# Patient Record
Sex: Female | Born: 1960
Health system: Southern US, Community
[De-identification: ages and names within clinical notes are randomized; demographics above are authoritative.]

## PROBLEM LIST (undated history)

## (undated) DIAGNOSIS — A048 Other specified bacterial intestinal infections: Secondary | ICD-10-CM

## (undated) DIAGNOSIS — D126 Benign neoplasm of colon, unspecified: Secondary | ICD-10-CM

## (undated) DIAGNOSIS — K2210 Ulcer of esophagus without bleeding: Secondary | ICD-10-CM

## (undated) DIAGNOSIS — K219 Gastro-esophageal reflux disease without esophagitis: Secondary | ICD-10-CM

## (undated) DIAGNOSIS — T7840XA Allergy, unspecified, initial encounter: Secondary | ICD-10-CM

## (undated) DIAGNOSIS — I341 Nonrheumatic mitral (valve) prolapse: Secondary | ICD-10-CM

## (undated) DIAGNOSIS — A159 Respiratory tuberculosis unspecified: Secondary | ICD-10-CM

## (undated) HISTORY — DX: Gastro-esophageal reflux disease without esophagitis: K21.9

## (undated) HISTORY — DX: Nonrheumatic mitral (valve) prolapse: I34.1

## (undated) HISTORY — PX: EXCISION OF TONGUE LESION: SHX6434

## (undated) HISTORY — DX: Other specified bacterial intestinal infections: A04.8

## (undated) HISTORY — DX: Ulcer of esophagus without bleeding: K22.10

## (undated) HISTORY — DX: Benign neoplasm of colon, unspecified: D12.6

## (undated) HISTORY — DX: Respiratory tuberculosis unspecified: A15.9

## (undated) HISTORY — PX: WRIST FRACTURE SURGERY: SHX121

## (undated) HISTORY — DX: Allergy, unspecified, initial encounter: T78.40XA

---

## 2001-09-11 ENCOUNTER — Other Ambulatory Visit: Admission: RE | Admit: 2001-09-11 | Discharge: 2001-09-11 | Payer: Self-pay | Admitting: Obstetrics and Gynecology

## 2002-10-13 ENCOUNTER — Other Ambulatory Visit: Admission: RE | Admit: 2002-10-13 | Discharge: 2002-10-13 | Payer: Self-pay | Admitting: Obstetrics and Gynecology

## 2003-09-16 ENCOUNTER — Other Ambulatory Visit: Admission: RE | Admit: 2003-09-16 | Discharge: 2003-09-16 | Payer: Self-pay | Admitting: Obstetrics and Gynecology

## 2004-07-17 ENCOUNTER — Ambulatory Visit (HOSPITAL_COMMUNITY): Admission: RE | Admit: 2004-07-17 | Discharge: 2004-07-17 | Payer: Self-pay | Admitting: Otolaryngology

## 2004-10-04 ENCOUNTER — Other Ambulatory Visit: Admission: RE | Admit: 2004-10-04 | Discharge: 2004-10-04 | Payer: Self-pay | Admitting: Obstetrics and Gynecology

## 2005-06-07 ENCOUNTER — Emergency Department (HOSPITAL_COMMUNITY): Admission: EM | Admit: 2005-06-07 | Discharge: 2005-06-07 | Payer: Self-pay | Admitting: Emergency Medicine

## 2006-01-10 ENCOUNTER — Other Ambulatory Visit: Admission: RE | Admit: 2006-01-10 | Discharge: 2006-01-10 | Payer: Self-pay | Admitting: Gynecology

## 2006-02-27 ENCOUNTER — Ambulatory Visit: Admission: RE | Admit: 2006-02-27 | Discharge: 2006-02-27 | Payer: Self-pay | Admitting: Gynecology

## 2006-04-03 HISTORY — PX: TOTAL ABDOMINAL HYSTERECTOMY: SHX209

## 2006-04-26 ENCOUNTER — Encounter (INDEPENDENT_AMBULATORY_CARE_PROVIDER_SITE_OTHER): Payer: Self-pay | Admitting: Specialist

## 2006-04-26 ENCOUNTER — Inpatient Hospital Stay (HOSPITAL_COMMUNITY): Admission: RE | Admit: 2006-04-26 | Discharge: 2006-04-30 | Payer: Self-pay | Admitting: Obstetrics and Gynecology

## 2010-09-12 ENCOUNTER — Encounter
Admission: RE | Admit: 2010-09-12 | Discharge: 2010-09-12 | Payer: Self-pay | Source: Home / Self Care | Attending: Otolaryngology | Admitting: Otolaryngology

## 2011-01-19 NOTE — Discharge Summary (Signed)
[name], [name]             ACCOUNT NO.:  [id number]   MEDICAL RECORD NO.:  [id number]          PATIENT TYPE:  INP   LOCATION:  1616                         FACILITY:  [facility]   PHYSICIAN:  Miguel Aschoff, M.D.       DATE OF BIRTH:  April 10, 1961   DATE OF ADMISSION:  04/26/2006  DATE OF DISCHARGE:  04/30/2006                                 DISCHARGE SUMMARY   ADMISSION DIAGNOSES:  1. Pelvic pain.  2. Extensive pelvic endometriosis.  3. Leiomyomata.   DISCHARGE DIAGNOSES:  1. Pelvic pain.  2. Extensive pelvic endometriosis.  3. Leiomyomata.   PROCEDURES:  1. Total abdominal hysterectomy.  2. Bilateral salpingo-oophorectomy.  3. Ureterolysis.   HISTORY OF PRESENT ILLNESS:  The patient is a 50 year old, [place] female  who has a long history of pelvic pain and on examination was noted to have  enlargement of uterus with large fibroids what appear to be a poorly mobile  uterus and enlargement of the fibroids to approximately 14-week equivalent  size.  Because of the degree of endometriosis involved, it was elected to  proceed with a total abdominal hysterectomy and bilateral salpingo-  oophorectomy.  The patient had been completely counseled as to the  implications of this procedure and the need for hormone replacement therapy  once it was done.  She agreed that the procedure should be carried out due  to her pelvic pain.   LABORATORY DATA AND X-RAY FINDINGS:  Preoperative labs were obtained with  admission hemoglobin of 9.6, white count of 6.1.  Chemistry profile on  admission was essentially within normal limits.   HOSPITAL COURSE:  Under general anesthesia, a total abdominal hysterectomy  and bilateral salpingo-oophorectomy were carried out.  The patient was noted  to have extensive endometriosis with large endometriomas, marked pelvic  adhesive disease involving the pelvic side wall and cul-de-sac.  The  procedure was carried out by Dr. De Blanch with  myself  assisting.  The patient's postoperative course was uncomplicated and by  postop day #4, the patient was in satisfactory condition and felt to be  stable enough to be discharged home.  Prior to discharge, she did complain  of some dysuria and a urine culture was carried out which grew 50,000  colonies of Citrobacter.   DIET:  Regular.   ACTIVITY:  No heavy lifting.   FOLLOW UP:  Return to the office in 4 weeks for followup exam.   DISCHARGE MEDICATIONS:  1. Tylox one every 3 hours as needed for pain.  2. Macrobid one p.o. b.i.d.   DISCHARGE LABORATORY DATA AND X-RAY FINDINGS:  The final pathology report  revealed slight cervicitis, squamous metaplasia, leiomyomata with intramural  and subserosal myomas, endometriosis of the uterine serosa and endometriotic  cysts of the right ovary and left ovary and residual ovary specimen  revealing endometriosis.  The uterine specimen weighed 434 g.   DISPOSITION:  The patient was sent home in satisfactory condition on a  regular diet.      Miguel Aschoff, M.D.  Electronically Signed     AR/MEDQ  D:  06/05/2006  T:  06/06/2006  Job:  [id number]   cc:   De Blanch, M.D.  [address]  [place]  [place] [address]

## 2011-01-19 NOTE — Op Note (Signed)
[name], [name]             ACCOUNT NO.:  [id number]   MEDICAL RECORD NO.:  [id number]          PATIENT TYPE:  INP   LOCATION:  1616                         FACILITY:  [facility]   PHYSICIAN:  De Blanch, M.D.DATE OF BIRTH:  [birth year]   DATE OF PROCEDURE:  04/26/2006  DATE OF DISCHARGE:                                 OPERATIVE REPORT   PREOPERATIVE DIAGNOSIS:  Symptomatic uterine fibroids and endometriosis.   POSTOPERATIVE DIAGNOSIS:  Symptomatic uterine fibroids and endometriosis,  right retroperitoneal fibrosis.   PROCEDURE:  Total abdominal hysterectomy with bilateral salpingo-  oophorectomy, right ureterolysis.   SURGEON:  De Blanch, M.D.   FIRST ASSISTANT:  Miguel Aschoff, M.D.  Telford Nab, R.N.   ANESTHESIA:  General with endotracheal tube.   ESTIMATED BLOOD LOSS:  200 mL.   SURGICAL FINDINGS:  At the time of exploratory laparotomy, the uterus was  irregular with a large 7 cm uterine fundal fibroid and some smaller  subserosal fibroids.  The right ovary was replaced by a 7 cm endometrioma  and there was a 3 cm endometrioma on the left ovary.  The right ovary was  densely adherent to the cul-de-sac and right pelvic sidewall resulting in  right retroperitoneal fibrosis necessitating ureterolysis.  The posterior  cul-de-sac was also densely adherent to the posterior aspect of the cervix  secondary to endometriosis. The appendix appeared normal.   PROCEDURE:  The patient was brought to the operating room and after  satisfactory attainment of general anesthesia, was placed in the modified  lithotomy position in [place] stirrups.  The anterior abdominal wall,  perineum, and vagina were prepped with Betadine.  A Foley catheter was  inserted.  The patient was draped.  The abdomen was entered through a  Pfannenstiel's incision.  The pelvis and abdomen were  explored with the  above noted findings.  A Bookwalter retractor was positioned and the bowel  was packed out of the pelvis.  The round ligaments were divided and the  retroperitoneal spaces opened identifying the ureter bilaterally.  The  ovarian vessels were skeletonized, clamped, cut, free tied, and suture  ligated.  The anterior leaf of the broad ligament was opened and the bladder  flap was advanced.  The right ovary was densely adherent to the pelvic  sidewall.  The ureter was identified and mobilized performing ureterolysis  down to the level of the uterine artery.  The peritoneum of the right pelvic  sidewall was then excised along with the ovarian cyst which was opened and  drained of chocolate material.  A similar procedure was performed on the  left side of the pelvis, opening the retroperitoneal spaces, isolating the  ovarian vessels, clamping, cutting, free tied, and suture ligating these  vessels.  The right broad ligament was mobilized away from the ovary.  The  ureter was free of all this dissection.  The uterine vessels were  skeletonized bilaterally, clamped, cut, and suture ligated.  The cardinal  ligaments were clamped, cut, and suture ligated.  The uterus was then  transected from its junction with the cervix.  The uterus and cervix, tubes  and  ovaries handed off the operative field.  Thus, we were able to gain  better exposure of the pelvis.  At this juncture, because the rectum was  densely adherent to the posterior aspect of the cervix, the rectovaginal  septum was opened with sharp and blunt dissection, thus mobilizing the  cervix away from the rectum.  The bladder flap was advanced beyond the level  of the cervix.  The paracervical and cardinal ligaments were clamped, cut,  and suture ligated.  The vaginal angles were then crossclamped and the  vagina transected from its connection with the cervix.  The cervix was  handed off the operative field.  The vaginal angles were transfixed with 0  Vicryl and the central portion of the vagina closed with interrupted  figure-  of-eight sutures of 0 Vicryl.  Hemostasis was found to be adequate.  In the  dense adhesions on the right pelvic sidewall, it appeared that a portion of  the ovarian capsule remained.  We, therefore, carefully dissected this away  from the ureter and the uterine veins. The uterine veins were clamped with  Anderson clamps and suture ligated with 2-0 Vicryl.  Additional hemoclips  were used to achieve hemostasis in this area and the peritoneum and probable  ovarian capsule were completely excised.  The ureter was reinspected and  found to be intact, peristaltic, and away from the area of dissection.  The  pelvis was irrigated.  Hemostasis was ascertained.  The packs and retractors  were removed.  The appendix was inspected and found to be free of any  evidence of endometriosis.   The anterior abdominal wall was closed in layers, the first being a running  2-0 Vicryl suture reapproximating the parietal peritoneum.  The subfascial  area and the rectus muscle was inspected.  Hemostasis achieved with cautery.  This area was irrigated.  The fascia was closed with a running suture of #1  PDS with an attempt to bury the knot at both angles in the center of the  incision.  The subcutaneous tissue was irrigated.  Hemostasis achieved with  cautery.  The skin was closed with skin staples.  A dressing was applied.  The patient was awakened from anesthesia and taken to the recovery room in  satisfactory condition.  Sponge, needle, and instrument counts were correct  x2.      De Blanch, M.D.  Electronically Signed     DC/MEDQ  D:  04/26/2006  T:  04/26/2006  Job:  [id number]   cc:   Miguel Aschoff, M.D.  Fax: [phone]   Telford Nab, R.N.  [address]  [place], [place] [address]

## 2011-01-19 NOTE — Consult Note (Signed)
[name], COLGATE             ACCOUNT NO.:  [id number]   MEDICAL RECORD NO.:  [id number]          PATIENT TYPE:  OUT   LOCATION:  GYN                          FACILITY:  [facility]   PHYSICIAN:  De Blanch, M.D.DATE OF BIRTH:  1961-04-05   DATE OF CONSULTATION:  DATE OF DISCHARGE:                                   CONSULTATION   NEW PATIENT CONSULTATION:   CHIEF COMPLAINT:  Fibroids and endometriosis.   HISTORY OF PRESENT ILLNESS:  This 50 year old Asian woman is seen in  consultation at the request of Dr. Donovan Kail regarding management of  uterine fibroids and endometriosis.   The patient apparently has a long-standing history of endometriosis and  underwent laparoscopy with laser vaporization of what she describes as  extensive endometriosis approximately 8 years ago.  Since November she has  had progressively worsening pelvic pain and has sought consultation. She has  seen Dr. Delia Heady, Dr. Miguel Aschoff, Dr. Richarda Overlie and Dr. Gaetano Hawthorne.  Lily Peer, M.D.  Apparently there has been a variety of opinions offered as  to the surgical management of her problem, essentially dividing between a  laparoscopic approach versus laparotomy approach. The patient after  considering her options, has felt that a laparotomy approach is most  appropriate.  Dr. Tenny Craw seeks our consultation as he is her primary  gynecologist at this juncture.  The patient continues to have regular cyclic  menstrual periods.  She has no other past gynecologic history except for  some cervical polyps which were removed many years ago.   PAST MEDICAL HISTORY:  1.  Mitral valve prolapse (this is apparently minor and the patient's      physician said that she does not need to have antibiotics).  2.  Gastroesophageal reflux disease.   PAST SURGICAL HISTORY:  Diagnostic laparoscopy, laparoscopy with laser  vaporization, removal of cervical polyps.   CURRENT MEDICATIONS:  The patient takes allergy medicine  as needed.   ALLERGIES:  PENICILLIN, DOXYCYCLINE AND TETRACYCLINE.   FAMILY HISTORY:  Negative for gynecologic, breast or colon cancer.   OBSTETRICAL HISTORY:  Gravida 3, 1, 2. The patient has 61 and 60 year old  children.   SOCIAL HISTORY:  The patient is married. She is a Psychologist, educational. Her husband is an Art gallery manager. She does not smoke.   REVIEW OF SYSTEMS:  Ten point comprehensive review of systems is negative  except as noted above.  Her primary symptoms are pelvic pain and cramping  and pressure.   PHYSICAL EXAMINATION:  VITAL SIGNS:  Height 5 feet, 2 inches. Weight 109  pounds. Blood pressure 120/72, pulse 80, respiratory rate 20.  GENERAL:  The patient is a healthy Asian female in no acute distress.  HEENT:  Negative.  NECK:  Supple without thyromegaly.  LYMPH NODES:  There is no supraclavicular or inguinal adenopathy.  ABDOMEN:  Soft and nontender. She has a palpable mass which is firm,  extending nearly to the umbilicus on the left side. On the right side there  is not a palpable. Mass but there is a modest amount of pain to deep  palpation.  PELVIC EXAM:  EGBUS, vagina, bladder, urethra are normal. Cervix is normal.  On bimanual exam there is a cystic mass palpable in the right adnexa  measuring approximately 7 cm in diameter.  The uterus is enlarged to  approximately 14-weeks size and is moderately mobile.  I do not feel a left  adnexal mass although this may be compromised by the uterine size and  location. Rectovaginal exam confirms.  LOWER EXTREMITIES:  Without edema or varicosities.   IMPRESSION:  Symptomatic uterine fibroids and endometriosis with an ovarian  cyst consistent with an endometrioma.  The patient is certain she wishes to  proceed with laparotomy as primary approach. We will coordinate this surgery  with Dr. Donovan Kail if at all possible. The patient is eager to have the  surgery done so that she can return to taking care of her children  as the  school year approaches.  I indicated to her that my availability in  [place] was considerably limited and that an option might be to have  surgery at [facility].   The risks of surgery including hemorrhage, infection, injury to adjacent  viscera, thromboembolic complication, anesthetic risk were outlined. The  patient is aware that she will lose both of her ovaries and that hormone  replacement therapy would likely be recommended. All of her questions were  answered. We will work with Dr. Tenny Craw' office to coordinate a surgical date.      De Blanch, M.D.  Electronically Signed     DC/MEDQ  D:  02/27/2006  T:  02/27/2006  Job:  [id number]   cc:   Miguel Aschoff, M.D.  Fax: [phone]   Telford Nab, R.N.  [address]  [place], [place] [address]

## 2011-09-04 DIAGNOSIS — D126 Benign neoplasm of colon, unspecified: Secondary | ICD-10-CM

## 2011-09-04 HISTORY — DX: Benign neoplasm of colon, unspecified: D12.6

## 2011-09-24 ENCOUNTER — Telehealth: Payer: Self-pay

## 2011-09-24 NOTE — Telephone Encounter (Signed)
All [facility] Cardiology Infor faxed to [facility] @ [phone] 09/24/11/KM

## 2011-11-26 ENCOUNTER — Encounter: Payer: Self-pay | Admitting: *Deleted

## 2012-01-31 ENCOUNTER — Other Ambulatory Visit: Payer: Self-pay | Admitting: Obstetrics and Gynecology

## 2012-02-19 ENCOUNTER — Encounter (HOSPITAL_COMMUNITY): Payer: Self-pay | Admitting: *Deleted

## 2012-02-19 ENCOUNTER — Emergency Department (HOSPITAL_COMMUNITY)
Admission: EM | Admit: 2012-02-19 | Discharge: 2012-02-19 | Disposition: A | Discharge status: Home / Self Care | Payer: BC Managed Care – PPO | Attending: Emergency Medicine | Admitting: Emergency Medicine

## 2012-02-19 ENCOUNTER — Emergency Department (HOSPITAL_COMMUNITY): Payer: BC Managed Care – PPO

## 2012-02-19 DIAGNOSIS — M25439 Effusion, unspecified wrist: Secondary | ICD-10-CM | POA: Insufficient documentation

## 2012-02-19 DIAGNOSIS — W19XXXA Unspecified fall, initial encounter: Secondary | ICD-10-CM | POA: Insufficient documentation

## 2012-02-19 DIAGNOSIS — M25539 Pain in unspecified wrist: Secondary | ICD-10-CM | POA: Insufficient documentation

## 2012-02-19 DIAGNOSIS — S62109A Fracture of unspecified carpal bone, unspecified wrist, initial encounter for closed fracture: Secondary | ICD-10-CM

## 2012-02-19 DIAGNOSIS — S52599A Other fractures of lower end of unspecified radius, initial encounter for closed fracture: Secondary | ICD-10-CM | POA: Insufficient documentation

## 2012-02-19 MED ORDER — ONDANSETRON 4 MG PO TBDP
ORAL_TABLET | ORAL | Status: AC
  Filled 2012-02-19: qty 1

## 2012-02-19 MED ORDER — ONDANSETRON 4 MG PO TBDP
4.0000 mg | ORAL_TABLET | Freq: Once | ORAL | Status: AC
  Administered 2012-02-19: 4 mg via ORAL

## 2012-02-19 MED ORDER — HYDROMORPHONE HCL PF 2 MG/ML IJ SOLN
1.0000 mg | Freq: Once | INTRAMUSCULAR | Status: AC
  Administered 2012-02-19: 1 mg via INTRAMUSCULAR
  Filled 2012-02-19: qty 1

## 2012-02-19 MED ORDER — HYDROCODONE-ACETAMINOPHEN 5-500 MG PO TABS: 1.0000 | ORAL_TABLET | Freq: Four times a day (QID) | ORAL | Status: AC | PRN

## 2012-02-19 MED ORDER — ONDANSETRON HCL 4 MG PO TABS: 4.0000 mg | ORAL_TABLET | Freq: Four times a day (QID) | ORAL | Status: AC

## 2012-02-19 NOTE — ED Provider Notes (Signed)
Medical screening examination/treatment/procedure(s) were performed by non-physician practitioner and as supervising physician I was immediately available for consultation/collaboration.   Senay Sistrunk L Fortune Torosian, MD 02/19/12 0810 

## 2012-02-19 NOTE — Discharge Instructions (Signed)
Cast or Splint Care Casts and splints support injured limbs and keep bones from moving while they heal.  HOME CARE  Keep the cast or splint uncovered during the drying period.   A plaster cast can take 24 to 48 hours to dry.   A fiberglass cast will dry in less than 1 hour.   Do not rest the cast on anything harder than a pillow for 24 hours.   Do not put weight on your injured limb. Do not put pressure on the cast. Wait for your doctor's approval.   Keep the cast or splint dry.   Cover the cast or splint with a plastic bag during baths or wet weather.   If you have a cast over your chest and belly (trunk), take sponge baths until the cast is taken off.   Keep your cast or splint clean. Wash a dirty cast with a damp cloth.   Do not put any objects under your cast or splint. Do not scratch the skin under the cast with an object.   Do not take out the padding from inside your cast.   Exercise your joints near the cast as told by your doctor.   Raise (elevate) your injured limb on 1 or 2 pillows for the first 1 to 3 days.  GET HELP RIGHT AWAY IF:  Your cast or splint cracks.   Your cast or splint is too tight or too loose.   You itch badly under the cast.   Your cast gets wet or has a soft spot.   You have a bad smell coming from the cast.   You get an object stuck under the cast.   Your skin around the cast becomes red or raw.   You have new or more pain after the cast is put on.   You have fluid leaking through the cast.   You cannot move your fingers or toes.   Your fingers or toes turn colors or are cool, painful, or puffy (swollen).   You have tingling or lose feeling (numbness) around the injured area.   You have pain or pressure under the cast.   You have trouble breathing or have shortness of breath.   You have chest pain.  MAKE SURE YOU:  Understand these instructions.   Will watch your condition.   Will get help right away if you are not doing  well or get worse.  Document Released: 12/20/2010 Document Revised: 08/09/2011 Document Reviewed: 12/20/2010 [facility] Patient Information 2012 [place], [place].Hand Fracture Your caregiver has diagnosed you with a fractured (broken) bone in your hand. If the bones are in good position and the hand is properly immobilized and rested, these injuries will usually heal in 3 to 6 weeks. A cast, splint, or bulky bandage is usually applied to keep the fracture site from moving. Do not remove the splint or cast until your caregiver approves. If the fracture is unstable or the bones are not aligned properly, surgery may be needed. Keep your hand raised (elevated) above the level of your heart as much as possible for the next 2 to 3 days until the swelling and pain are better. Apply ice packs for 15 to 20 minutes every 3 to 4 hours to help control the pain and swelling. See your caregiver or an orthopedic specialist as directed for follow-up care to make sure the fracture is beginning to heal properly. SEEK IMMEDIATE MEDICAL CARE IF:   You notice your fingers are cold, numb,  crooked, or the pain of your injury is severe.   You are not improving or seem to be getting worse.   You have questions or concerns.  Document Released: 09/27/2004 Document Revised: 08/09/2011 Document Reviewed: 02/15/2009 [facility] Patient Information 2012 [place], [place].Wrist Fracture Your caregiver has diagnosed you as having a fracture of the wrist. A fracture is a break in the bone or bones. A cast or splint is used to protect and keep your injured bone(s) from moving. The cast or splint will usually be on for about 5 to 6 weeks. One of the bones of the wrist (the navicular bone) often does not show up as a fracture on X-ray until later or in the healing phase. With this bone your caregiver will often cast as though it is fractured even if not seen on the X-ray. HOME CARE INSTRUCTIONS   To lessen the swelling, keep the injured part  elevated while sitting or lying down. Keeping the injury above the level of your heart (the center of the chest) will decrease swelling and pain.   Do not wear rings or jewelry on the injured hand or wrist.   Apply ice to the injury for 15 to 20 minutes, 3 to 4 times per day while awake for 2 days. Put the ice in a plastic bag and place a thin towel between the bag of ice and your cast.   If you have a plaster or fiberglass cast:   Do not try to scratch the skin under the cast using sharp or pointed objects.   Check the skin around the cast every day. You may put lotion on any red or sore areas.   Keep your cast dry and clean.   If you have a plaster splint:   Wear the splint as directed.   You may loosen the elastic around the splint if your fingers become numb, tingle, or turn cold or blue.   If you have been put in a removable splint, wear and use as directed.   Do not use powders or deodorants in or around the cast or splint.   Do not remove padding from your cast or splint.   Do not put pressure on any part of your cast or splint. It may break. Rest your cast or splint only on a pillow the first 24 hours until it is fully hardened.   Gently move your fingers often, so they do not get stiff.   Do not remove the splint unless directed by your caregiver. Casts must be removed by an orthopedist.   Your cast or splint can be protected during bathing with a plastic bag. Do not lower the cast or splint into water.   Only take over-the-counter or prescription medicines for pain, discomfort, or fever as directed by your caregiver.   Follow up with your caregiver as directed.  SEEK IMMEDIATE MEDICAL CARE IF:   Your cast or splint gets damaged or breaks.   Your cast or splint feels too tight or loose.   You have increased pain, not controlled with medication.   You have increased swelling.   Your skin or nails below the injury turn blue or grey or feel cold or numb.   You  have trouble moving or feeling your fingers.   You experience any burning or stinging from the cast or splint.   There is a bad smell coming from under the cast or splint.   New stains or fluids are coming from under the  cast or splint.   You have any new injuries while wearing the cast or splint.  Document Released: 05/30/2005 Document Revised: 08/09/2011 Document Reviewed: 03/19/2007 [facility] Patient Information 2012 [place], [place].

## 2012-02-19 NOTE — ED Provider Notes (Signed)
History     CSN: [id number]  Arrival date & time 02/19/12  0009   First MD Initiated Contact with Patient 02/19/12 0117      Chief Complaint  Patient presents with  . Wrist Injury    (Consider location/radiation/quality/duration/timing/severity/associated sxs/prior treatment) HPI  She presents to the emergency department after falling and landing on her left hand. She presents to the ER with left wrist pain and swelling. Deformity noted. Patient denies injury to any other area, she denies hitting her head she denies neck pain she denies syncope or loss of consciousness. The patient denies history of falls. The patient is in no acute distress at this time he denies any other pains or concerns  Past Medical History  Diagnosis Date  . Tuberculosis   . Endometriosis   . Mitral valve prolapse     Past Surgical History  Procedure Date  . Total abdominal hysterectomy 04/2006    Family History  Problem Relation Age of Onset  . Heart attack Father     History  Substance Use Topics  . Smoking status: Not on file  . Smokeless tobacco: Not on file  . Alcohol Use:     OB History    Grav Para Term Preterm Abortions TAB SAB Ect Mult Living                  Review of Systems   HEENT: denies blurry vision or change in hearing PULMONARY: Denies difficulty breathing and SOB CARDIAC: denies chest pain or heart palpitations MUSCULOSKELETAL:  denies being unable to ambulate ABDOMEN AL: denies abdominal pain GU: denies loss of bowel or urinary control NEURO: denies numbness and tingling in extremities      Allergies  Doxycycline; Penicillins; and Tetracyclines & related  Home Medications   Current Outpatient Rx  Name Route Sig Dispense Refill  . OMEGA-3 FATTY ACIDS 1000 MG PO CAPS Oral Take 2 g by mouth daily.    . MULTIVITAMINS PO TABS Oral Take 1 tablet by mouth daily.    Marland Kitchen HYDROCODONE-ACETAMINOPHEN 5-500 MG PO TABS Oral Take 1 tablet by mouth every 6 (six) hours as  needed for pain. 10 tablet 0  . ONDANSETRON HCL 4 MG PO TABS Oral Take 1 tablet (4 mg total) by mouth every 6 (six) hours. 12 tablet 0    BP 110/72  Pulse 71  Temp 97.8 F (36.6 C) (Oral)  Resp 14  SpO2 91%  Physical Exam  Nursing note and vitals reviewed. Constitutional: She appears well-developed and well-nourished. No distress.  HENT:  Head: Normocephalic and atraumatic.  Eyes: Pupils are equal, round, and reactive to light.  Neck: Normal range of motion. Neck supple.  Cardiovascular: Normal rate and regular rhythm.   Pulmonary/Chest: Effort normal.  Abdominal: Soft.  Musculoskeletal:       Left hand: She exhibits decreased range of motion, tenderness, deformity and swelling. She exhibits no bony tenderness, normal two-point discrimination, normal capillary refill and no laceration. normal sensation noted. Normal strength noted.       Hands: Neurological: She is alert.  Skin: Skin is warm and dry.    ED Course  Procedures (including critical care time)  Labs Reviewed - No data to display Dg Wrist Complete Left  02/19/2012  *RADIOLOGY REPORT*  Clinical Data: Fall.  Wrist pain, swelling, and deformity.  LEFT WRIST - COMPLETE 3+ VIEW  Comparison: None.  Findings: A comminuted fracture of the distal radial metaphysis is seen, with intra-articular extension into both the  radiocarpal and distal radial ulnar joints.  There is mild dorsal angulation of the distal articular surface of the radius.  Associated ulnar styloid process fracture also noted.  Carpal bones remain normal in appearance and alignment.  IMPRESSION:  1.  Comminuted fracture of the distal radius with involvement of both the radiocarpal and distal radial ulnar joints, and mild dorsal angulation. 2.  Fracture through ulnar styloid process.  Original Report Authenticated By: Danae Orleans, M.D.     1. Wrist fracture       MDM   Patient given 1 mg of Dilaudid in the ED. A sugar tong splint has been placed as the  patient has multiple fractures within the wrist. He is neurovascularly intact.  Patient has been referred to a hand surgeon the family has been requested that she followup this week. Patient will get a prescription for Percocet.   Xrays have been reviewed with Dr. Read Drivers who agrees with plan.   Pt has been advised of the symptoms that warrant their return to the ED. Patient has voiced understanding and has agreed to follow-up with the PCP or specialist.         Dorthula Matas, PA 02/19/12 [phone]

## 2012-02-19 NOTE — ED Notes (Signed)
Pt reports falling earlier this evening onto her hands - pt fell onto a hard floor and is now experiencing left wrist pain - deformity noted, CMS intact, ice pack applied. Guarding and grimacing noted.

## 2012-02-19 NOTE — ED Notes (Signed)
Pt reports falling earlier this evening injuring her left wrist - pt w/ obvious deformity to extremity, CMS intact.

## 2012-02-19 NOTE — ED Notes (Signed)
Rx given x2 D/c instructions reviewed w/ pt and family - pt and family deny any further questions or concerns at present.  

## 2012-09-01 ENCOUNTER — Other Ambulatory Visit: Payer: Self-pay | Admitting: Otolaryngology

## 2013-01-07 IMAGING — CR DG WRIST COMPLETE 3+V*L*
3 series · 3 of 3 positions shown · non-contrast
Comparison: None.

CLINICAL DATA: Fall.  Wrist pain, swelling, and deformity.

LEFT WRIST - COMPLETE 3+ VIEW

[x wrist pa left]
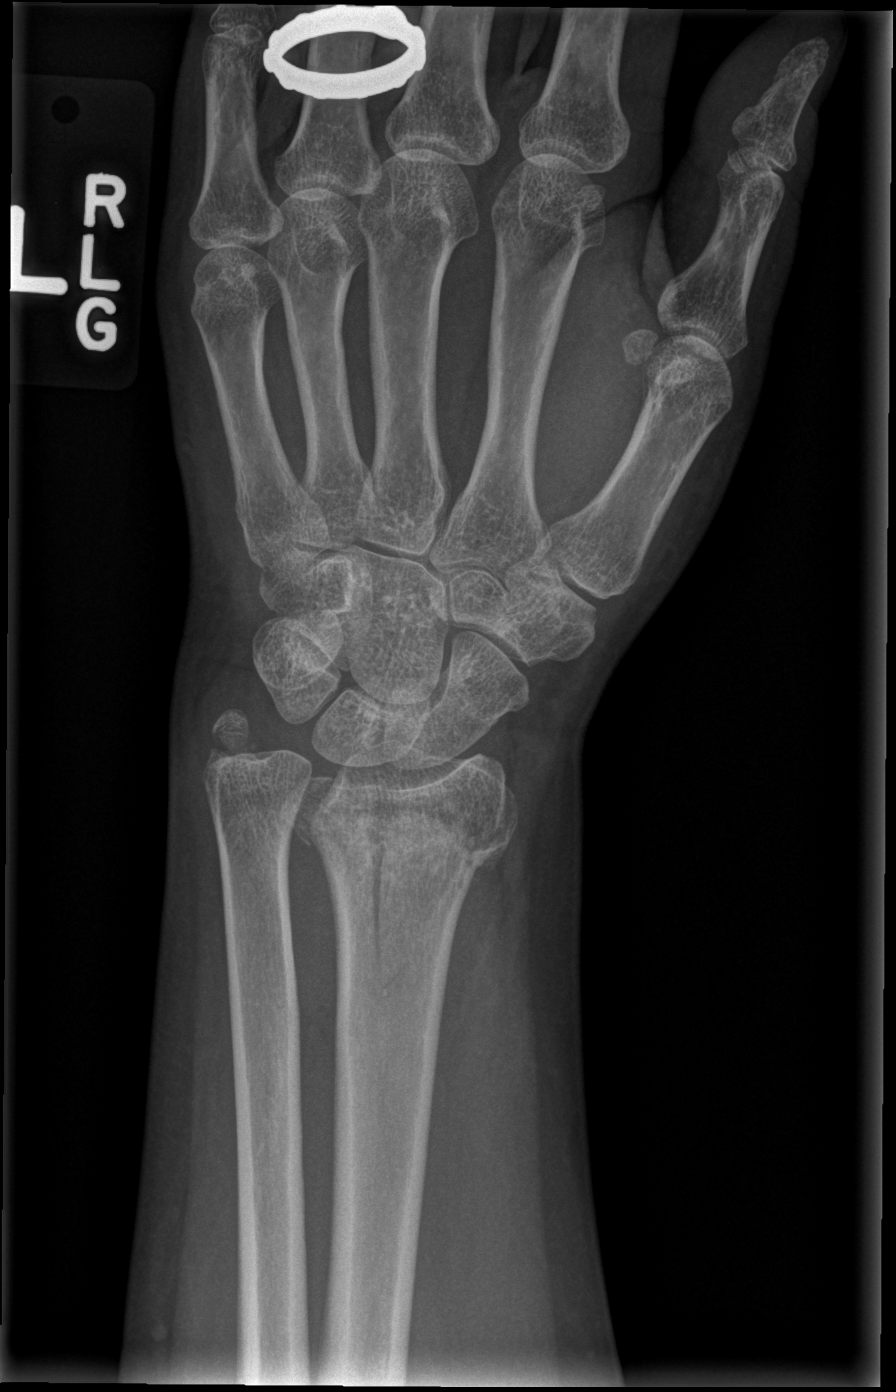

[x wrist obl left]
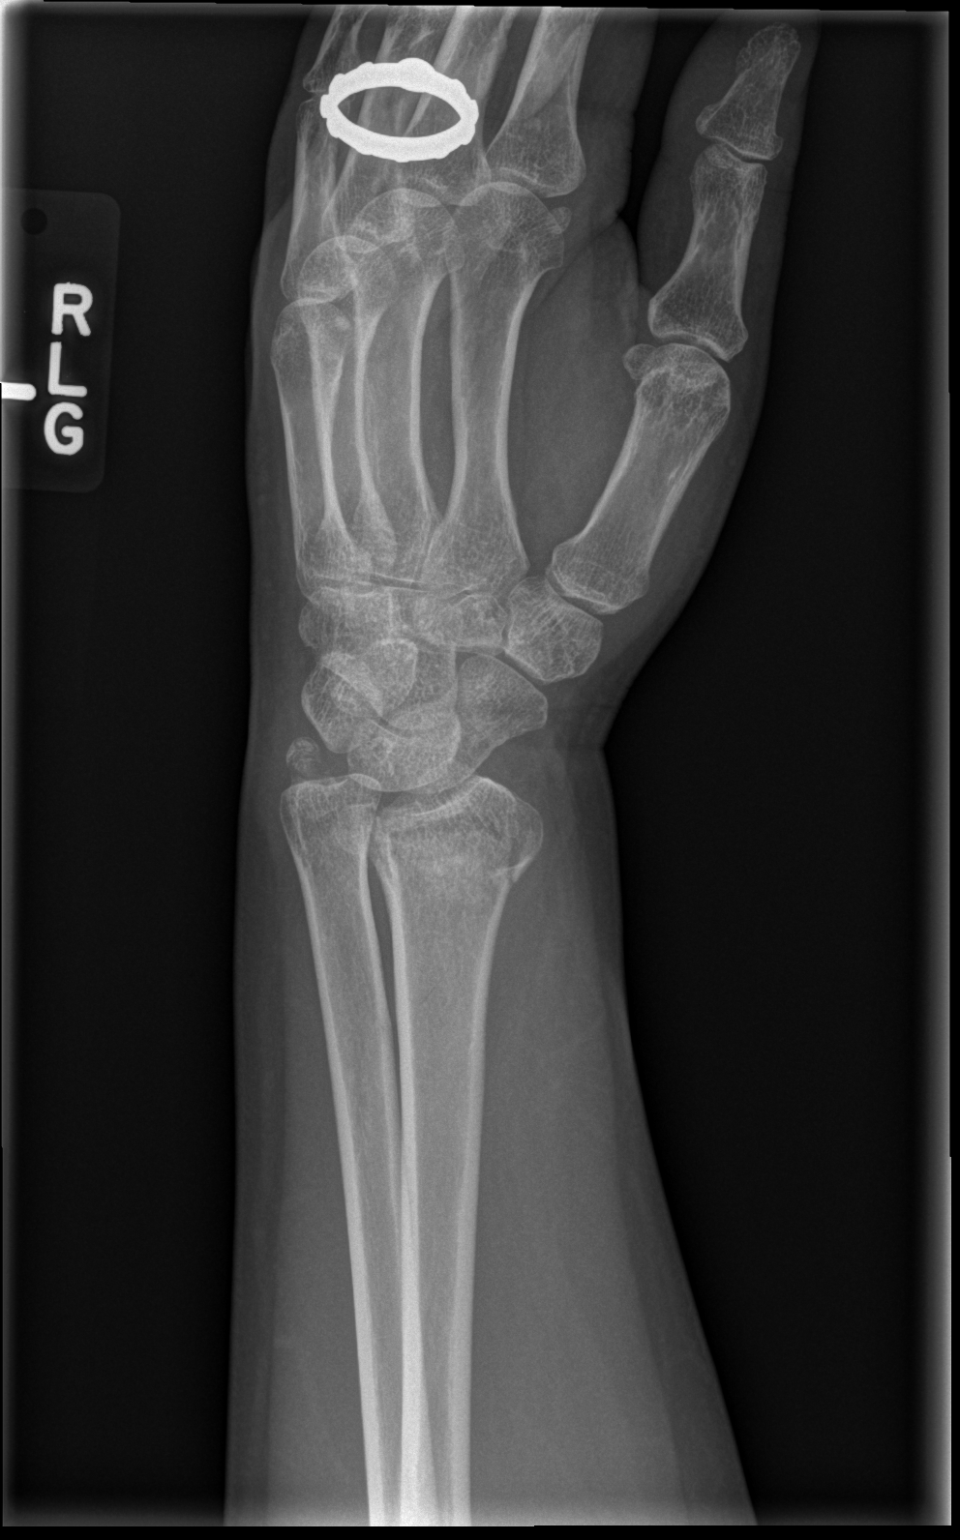

[x wrist lat left]
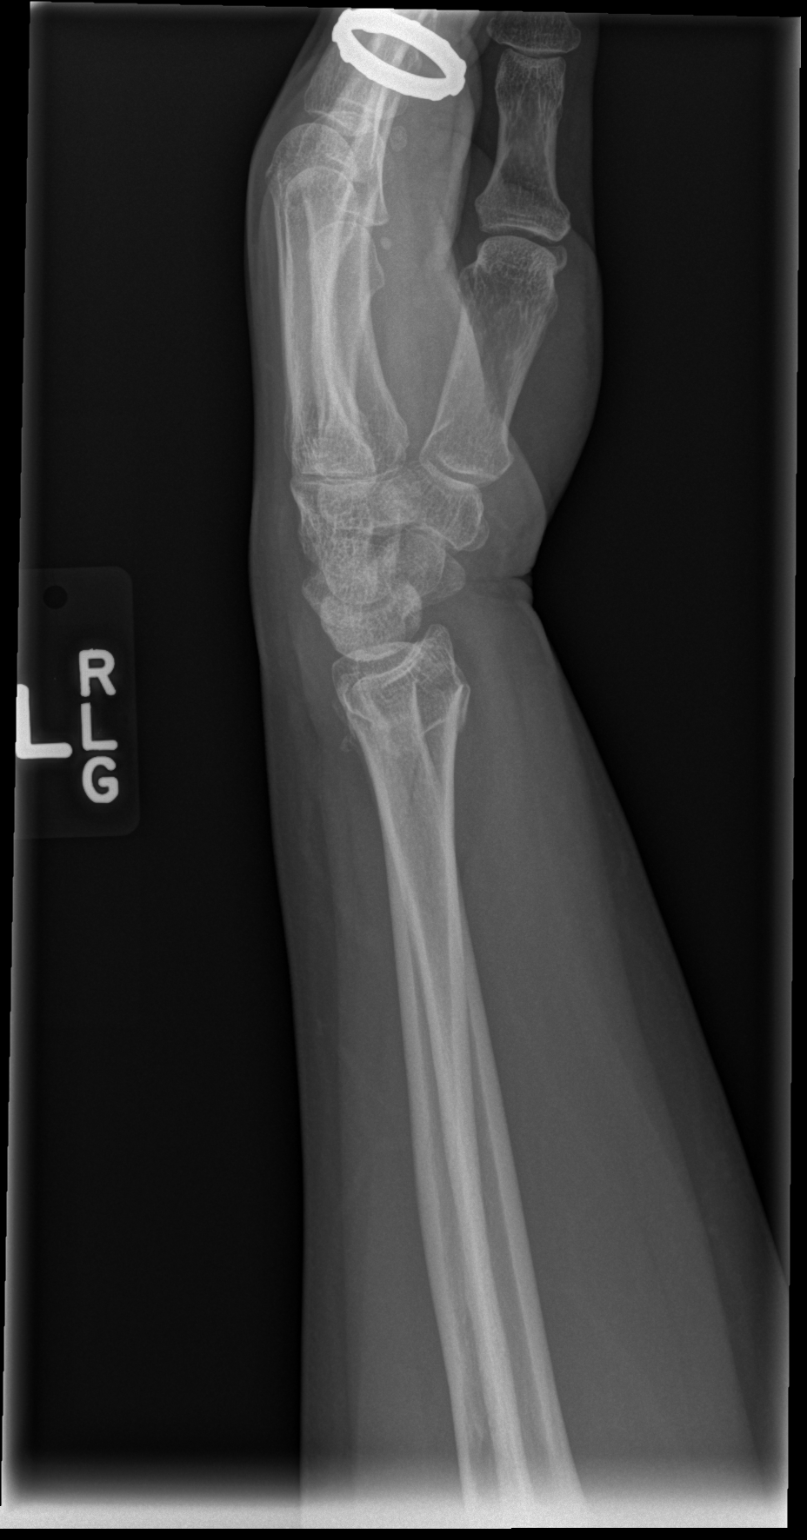

[3 of 3 positions shown; findings below may reference images not displayed]

FINDINGS: A comminuted fracture of the distal radial metaphysis is
seen, with intra-articular extension into both the radiocarpal and
distal radial ulnar joints.  There is mild dorsal angulation of the
distal articular surface of the radius.

Associated ulnar styloid process fracture also noted.  Carpal bones
remain normal in appearance and alignment.
IMPRESSION: 1.  Comminuted fracture of the distal radius with involvement of
both the radiocarpal and distal radial ulnar joints, and mild
dorsal angulation.
2.  Fracture through ulnar styloid process.

## 2013-07-13 ENCOUNTER — Other Ambulatory Visit: Payer: Self-pay | Admitting: Family Medicine

## 2013-07-13 DIAGNOSIS — R110 Nausea: Secondary | ICD-10-CM

## 2013-07-13 DIAGNOSIS — R1011 Right upper quadrant pain: Secondary | ICD-10-CM

## 2013-07-23 ENCOUNTER — Ambulatory Visit
Admission: RE | Admit: 2013-07-23 | Discharge: 2013-07-23 | Disposition: A | Discharge status: Home / Self Care | Payer: BC Managed Care – PPO | Source: Ambulatory Visit | Attending: Family Medicine | Admitting: Family Medicine

## 2013-07-23 DIAGNOSIS — R110 Nausea: Secondary | ICD-10-CM

## 2013-07-23 DIAGNOSIS — R1011 Right upper quadrant pain: Secondary | ICD-10-CM

## 2014-09-20 ENCOUNTER — Encounter: Payer: Self-pay | Admitting: Gastroenterology

## 2014-11-09 ENCOUNTER — Ambulatory Visit (INDEPENDENT_AMBULATORY_CARE_PROVIDER_SITE_OTHER): Payer: BLUE CROSS/BLUE SHIELD | Admitting: Gastroenterology

## 2014-11-09 ENCOUNTER — Encounter: Payer: Self-pay | Admitting: Gastroenterology

## 2014-11-09 VITALS — BP 114/74 | HR 72 | Ht 60.25 in | Wt 129.4 lb

## 2014-11-09 DIAGNOSIS — K219 Gastro-esophageal reflux disease without esophagitis: Secondary | ICD-10-CM

## 2014-11-09 DIAGNOSIS — R0989 Other specified symptoms and signs involving the circulatory and respiratory systems: Secondary | ICD-10-CM

## 2014-11-09 DIAGNOSIS — Z86010 Personal history of colonic polyps: Secondary | ICD-10-CM

## 2014-11-09 DIAGNOSIS — F458 Other somatoform disorders: Secondary | ICD-10-CM

## 2014-11-09 NOTE — Progress Notes (Signed)
History of Present Illness: This is a 54 year old female referred by Dr. Cari Caraway for the evaluation of globus sensation, GERD, RUQ pain. She has previously been followed by Dr. Earlie Raveling. She underwent upper endoscopy for globus sensation and GERD in January 2013 and the endoscopy was normal. Clo biopsy was positive and she was treated for the third time for H. pylori. Colonoscopy performed in January 2013 showed 2 colon polyps and internal hemorrhoids. Pathology revealed tubular adenomas. She relates a constant sensation of a lump in her throat. Symptoms sometimes increase with swallowing. They have been present for the majority of the time for the past several years. She states she was treated with Nexium for several months without a change in symptoms. He states she's been evaluated by Dr. Melissa Montane, ENT, with no clear source of globus sensation uncovered. She does endorse sinus drainage. She follows up diet restrictions very closely for management of heartburn and regurgitation. She notes occasional mild right upper quadrant and following meals. Abdominal ultrasound performed November 2014 showed sludge or non-shadowing stones. Denies weight loss, abdominal pain, constipation, diarrhea, change in stool caliber, melena, hematochezia, nausea, vomiting, dysphagia, reflux symptoms, chest pain.   Allergies  Allergen Reactions  . Doxycycline   . Penicillins   . Tetracyclines & Related    Outpatient Prescriptions Prior to Visit  Medication Sig Dispense Refill  . fish oil-omega-3 fatty acids 1000 MG capsule Take 2 g by mouth daily.    . multivitamin (THERAGRAN) per tablet Take 1 tablet by mouth daily.     No facility-administered medications prior to visit.   Past Medical History  Diagnosis Date  . Tuberculosis   . Endometriosis   . Mitral valve prolapse   . Tubular adenoma of colon 09/2011  . Positive H. pylori test   . GERD (gastroesophageal reflux disease)   . Erosive esophagitis     Past Surgical History  Procedure Laterality Date  . Total abdominal hysterectomy  04/2006  . Wrist fracture surgery Left   . Excision of tongue lesion      cyst under tongue   History   Social History  . Marital Status: Single    Spouse Name: N/A  . Number of Children: 2  . Years of Education: N/A   Occupational History  . CPA    Social History Main Topics  . Smoking status: Never Smoker   . Smokeless tobacco: Never Used  . Alcohol Use: 0.0 oz/week    0 Standard drinks or equivalent per week     Comment: rarely 2-3 times per year  . Drug Use: No  . Sexual Activity: Not on file   Other Topics Concern  . None   Social History Narrative   Family History  Problem Relation Age of Onset  . Heart attack Father 43  . Prostate cancer Father   . Hypertension Mother   . Epilepsy Mother   . Lung cancer Father     smoker      Review of Systems: Pertinent positive and negative review of systems were noted in the above HPI section. All other review of systems were otherwise negative.   Physical Exam: General: Well developed, well nourished, no acute distress Head: Normocephalic and atraumatic Eyes:  sclerae anicteric, EOMI Ears: Normal auditory acuity Mouth: No deformity or lesions Neck: Supple, no masses or thyromegaly Lungs: Clear throughout to auscultation Heart: Regular rate and rhythm; no murmurs, rubs or bruits Abdomen: Soft, non tender and non distended. No  masses, hepatosplenomegaly or hernias noted. Normal Bowel sounds Musculoskeletal: Symmetrical with no gross deformities  Skin: No lesions on visible extremities Pulses:  Normal pulses noted Extremities: No clubbing, cyanosis, edema or deformities noted Neurological: Alert oriented x 4, grossly nonfocal Cervical Nodes:  No significant cervical adenopathy Inguinal Nodes: No significant inguinal adenopathy Psychological:  Alert and cooperative. Normal mood and affect  Assessment and Recommendations:  1.  Globus sensation and a history of GERD. Normal endoscopy in January 2013. Normal ENT evaluation per her report. Her globus symptoms have not responded at all to PPI and dietary therapy although the rest of her reflux symptoms have responded well. It is unlikely that her globus sensation is related to acid reflux. Due to the persistent nature of her symptoms she is interested in reevaluation with upper endoscopy and I think this is reasonable. Continue all standard antireflux measures. She is not interested in resuming acid suppression therapy at this time. She has been reevaluated by Dr. Janace Hoard within the past year. Consider allergist evaluation. The risks (including bleeding, perforation, infection, missed lesions, medication reactions and possible hospitalization or surgery if complications occur), benefits, and alternatives to endoscopy with possible biopsy and possible dilation were discussed with the patient and they consent to proceed.   2. Personal history of adenomatous colon polyps. Surveillance colonoscopy was recommended at a five-year interval-due in January 2018.  3. Intermittent, mild prandial right upper quadrant pain. Possible gallbladder symptoms. If symptoms worsen consider repeat ultrasound.   cc: Cari Caraway, MD

## 2014-11-09 NOTE — Patient Instructions (Signed)
Please call our office next week to schedule your Endoscopy for May/June. If you have questions regarding your insurance please contact our Radiographer, therapeutic.  Patient advised to avoid spicy, acidic, citrus, chocolate, mints, fruit and fruit juices.  Limit the intake of caffeine, alcohol and Soda.  Don't exercise too soon after eating.  Don't lie down within 3-4 hours of eating.  Elevate the head of your bed.  Thank you for choosing me and [facility].  Pricilla Riffle. Dagoberto Ligas., MD., Marval Regal

## 2015-09-30 ENCOUNTER — Other Ambulatory Visit: Payer: Self-pay | Admitting: Family Medicine

## 2015-09-30 DIAGNOSIS — R1013 Epigastric pain: Secondary | ICD-10-CM

## 2015-10-07 ENCOUNTER — Ambulatory Visit
Admission: RE | Admit: 2015-10-07 | Discharge: 2015-10-07 | Disposition: A | Discharge status: Home / Self Care | Payer: BLUE CROSS/BLUE SHIELD | Source: Ambulatory Visit | Attending: Family Medicine | Admitting: Family Medicine

## 2015-10-07 DIAGNOSIS — R1013 Epigastric pain: Secondary | ICD-10-CM

## 2015-10-21 ENCOUNTER — Other Ambulatory Visit: Payer: Self-pay | Admitting: Obstetrics and Gynecology

## 2015-10-24 LAB — CYTOLOGY - PAP

## 2016-07-05 ENCOUNTER — Encounter: Payer: Self-pay | Admitting: Gastroenterology

## 2016-07-16 ENCOUNTER — Encounter: Payer: Self-pay | Admitting: Gastroenterology

## 2016-07-24 DIAGNOSIS — R05 Cough: Secondary | ICD-10-CM | POA: Diagnosis not present

## 2017-04-08 DIAGNOSIS — R1031 Right lower quadrant pain: Secondary | ICD-10-CM | POA: Diagnosis not present

## 2017-04-08 DIAGNOSIS — K5900 Constipation, unspecified: Secondary | ICD-10-CM | POA: Diagnosis not present

## 2017-04-08 DIAGNOSIS — K219 Gastro-esophageal reflux disease without esophagitis: Secondary | ICD-10-CM | POA: Diagnosis not present

## 2017-04-11 DIAGNOSIS — R946 Abnormal results of thyroid function studies: Secondary | ICD-10-CM | POA: Diagnosis not present

## 2017-04-11 DIAGNOSIS — R7301 Impaired fasting glucose: Secondary | ICD-10-CM | POA: Diagnosis not present

## 2017-04-16 DIAGNOSIS — R946 Abnormal results of thyroid function studies: Secondary | ICD-10-CM | POA: Diagnosis not present

## 2017-04-16 DIAGNOSIS — H6983 Other specified disorders of Eustachian tube, bilateral: Secondary | ICD-10-CM | POA: Diagnosis not present

## 2017-04-16 DIAGNOSIS — K219 Gastro-esophageal reflux disease without esophagitis: Secondary | ICD-10-CM | POA: Diagnosis not present

## 2017-04-16 DIAGNOSIS — R0789 Other chest pain: Secondary | ICD-10-CM | POA: Diagnosis not present

## 2017-04-30 DIAGNOSIS — Z1231 Encounter for screening mammogram for malignant neoplasm of breast: Secondary | ICD-10-CM | POA: Diagnosis not present

## 2017-04-30 DIAGNOSIS — Z6823 Body mass index (BMI) 23.0-23.9, adult: Secondary | ICD-10-CM | POA: Diagnosis not present

## 2017-04-30 DIAGNOSIS — Z01419 Encounter for gynecological examination (general) (routine) without abnormal findings: Secondary | ICD-10-CM | POA: Diagnosis not present

## 2017-05-01 ENCOUNTER — Other Ambulatory Visit: Payer: Self-pay | Admitting: Obstetrics and Gynecology

## 2017-05-01 DIAGNOSIS — E2839 Other primary ovarian failure: Secondary | ICD-10-CM

## 2017-05-10 ENCOUNTER — Ambulatory Visit
Admission: RE | Admit: 2017-05-10 | Discharge: 2017-05-10 | Disposition: A | Discharge status: Home / Self Care | Payer: BLUE CROSS/BLUE SHIELD | Source: Ambulatory Visit | Attending: Obstetrics and Gynecology | Admitting: Obstetrics and Gynecology

## 2017-05-10 DIAGNOSIS — M8589 Other specified disorders of bone density and structure, multiple sites: Secondary | ICD-10-CM | POA: Diagnosis not present

## 2017-05-10 DIAGNOSIS — E2839 Other primary ovarian failure: Secondary | ICD-10-CM

## 2017-05-10 DIAGNOSIS — Z780 Asymptomatic menopausal state: Secondary | ICD-10-CM | POA: Diagnosis not present

## 2017-05-14 DIAGNOSIS — M25522 Pain in left elbow: Secondary | ICD-10-CM | POA: Diagnosis not present

## 2017-05-14 DIAGNOSIS — M654 Radial styloid tenosynovitis [de Quervain]: Secondary | ICD-10-CM | POA: Diagnosis not present

## 2017-05-14 DIAGNOSIS — M25531 Pain in right wrist: Secondary | ICD-10-CM | POA: Diagnosis not present

## 2017-05-14 DIAGNOSIS — M7712 Lateral epicondylitis, left elbow: Secondary | ICD-10-CM | POA: Diagnosis not present

## 2017-05-30 DIAGNOSIS — Z23 Encounter for immunization: Secondary | ICD-10-CM | POA: Diagnosis not present

## 2017-06-11 DIAGNOSIS — M7712 Lateral epicondylitis, left elbow: Secondary | ICD-10-CM | POA: Diagnosis not present

## 2017-06-11 DIAGNOSIS — M654 Radial styloid tenosynovitis [de Quervain]: Secondary | ICD-10-CM | POA: Diagnosis not present

## 2017-06-11 DIAGNOSIS — M25531 Pain in right wrist: Secondary | ICD-10-CM | POA: Diagnosis not present

## 2017-06-11 DIAGNOSIS — M25522 Pain in left elbow: Secondary | ICD-10-CM | POA: Diagnosis not present

## 2017-06-17 DIAGNOSIS — M7712 Lateral epicondylitis, left elbow: Secondary | ICD-10-CM | POA: Diagnosis not present

## 2017-06-17 DIAGNOSIS — M25522 Pain in left elbow: Secondary | ICD-10-CM | POA: Diagnosis not present

## 2017-06-19 DIAGNOSIS — M7712 Lateral epicondylitis, left elbow: Secondary | ICD-10-CM | POA: Diagnosis not present

## 2017-06-19 DIAGNOSIS — M25522 Pain in left elbow: Secondary | ICD-10-CM | POA: Diagnosis not present

## 2017-06-25 DIAGNOSIS — M25522 Pain in left elbow: Secondary | ICD-10-CM | POA: Diagnosis not present

## 2017-07-02 DIAGNOSIS — M25522 Pain in left elbow: Secondary | ICD-10-CM | POA: Diagnosis not present

## 2017-07-02 DIAGNOSIS — M25632 Stiffness of left wrist, not elsewhere classified: Secondary | ICD-10-CM | POA: Diagnosis not present

## 2017-07-04 DIAGNOSIS — M25522 Pain in left elbow: Secondary | ICD-10-CM | POA: Diagnosis not present

## 2017-07-08 DIAGNOSIS — M25531 Pain in right wrist: Secondary | ICD-10-CM | POA: Diagnosis not present

## 2017-07-08 DIAGNOSIS — M25522 Pain in left elbow: Secondary | ICD-10-CM | POA: Diagnosis not present

## 2017-07-08 DIAGNOSIS — M7712 Lateral epicondylitis, left elbow: Secondary | ICD-10-CM | POA: Diagnosis not present

## 2017-07-08 DIAGNOSIS — M654 Radial styloid tenosynovitis [de Quervain]: Secondary | ICD-10-CM | POA: Diagnosis not present

## 2017-07-09 DIAGNOSIS — M7712 Lateral epicondylitis, left elbow: Secondary | ICD-10-CM | POA: Diagnosis not present

## 2017-07-09 DIAGNOSIS — M25522 Pain in left elbow: Secondary | ICD-10-CM | POA: Diagnosis not present

## 2017-07-16 DIAGNOSIS — M25522 Pain in left elbow: Secondary | ICD-10-CM | POA: Diagnosis not present

## 2017-07-24 DIAGNOSIS — M25522 Pain in left elbow: Secondary | ICD-10-CM | POA: Diagnosis not present

## 2017-07-24 DIAGNOSIS — M25632 Stiffness of left wrist, not elsewhere classified: Secondary | ICD-10-CM | POA: Diagnosis not present

## 2017-08-06 DIAGNOSIS — M25531 Pain in right wrist: Secondary | ICD-10-CM | POA: Diagnosis not present

## 2017-08-06 DIAGNOSIS — M654 Radial styloid tenosynovitis [de Quervain]: Secondary | ICD-10-CM | POA: Diagnosis not present

## 2017-08-06 DIAGNOSIS — M25522 Pain in left elbow: Secondary | ICD-10-CM | POA: Diagnosis not present

## 2017-08-06 DIAGNOSIS — M7712 Lateral epicondylitis, left elbow: Secondary | ICD-10-CM | POA: Diagnosis not present

## 2017-08-24 DIAGNOSIS — L259 Unspecified contact dermatitis, unspecified cause: Secondary | ICD-10-CM | POA: Diagnosis not present

## 2017-08-24 DIAGNOSIS — H6693 Otitis media, unspecified, bilateral: Secondary | ICD-10-CM | POA: Diagnosis not present

## 2017-10-31 DIAGNOSIS — R946 Abnormal results of thyroid function studies: Secondary | ICD-10-CM | POA: Diagnosis not present

## 2017-11-06 DIAGNOSIS — K829 Disease of gallbladder, unspecified: Secondary | ICD-10-CM | POA: Diagnosis not present

## 2017-11-06 DIAGNOSIS — K219 Gastro-esophageal reflux disease without esophagitis: Secondary | ICD-10-CM | POA: Diagnosis not present

## 2017-11-06 DIAGNOSIS — R946 Abnormal results of thyroid function studies: Secondary | ICD-10-CM | POA: Diagnosis not present

## 2018-01-02 DIAGNOSIS — J301 Allergic rhinitis due to pollen: Secondary | ICD-10-CM | POA: Diagnosis not present

## 2018-01-02 DIAGNOSIS — J320 Chronic maxillary sinusitis: Secondary | ICD-10-CM | POA: Diagnosis not present

## 2018-01-06 DIAGNOSIS — J019 Acute sinusitis, unspecified: Secondary | ICD-10-CM | POA: Diagnosis not present

## 2018-01-06 DIAGNOSIS — H6983 Other specified disorders of Eustachian tube, bilateral: Secondary | ICD-10-CM | POA: Diagnosis not present

## 2018-06-19 DIAGNOSIS — R7301 Impaired fasting glucose: Secondary | ICD-10-CM | POA: Diagnosis not present

## 2018-06-19 DIAGNOSIS — R946 Abnormal results of thyroid function studies: Secondary | ICD-10-CM | POA: Diagnosis not present

## 2018-06-25 DIAGNOSIS — R946 Abnormal results of thyroid function studies: Secondary | ICD-10-CM | POA: Diagnosis not present

## 2018-06-25 DIAGNOSIS — J301 Allergic rhinitis due to pollen: Secondary | ICD-10-CM | POA: Diagnosis not present

## 2018-06-25 DIAGNOSIS — K5900 Constipation, unspecified: Secondary | ICD-10-CM | POA: Diagnosis not present

## 2018-06-25 DIAGNOSIS — K219 Gastro-esophageal reflux disease without esophagitis: Secondary | ICD-10-CM | POA: Diagnosis not present

## 2018-07-14 DIAGNOSIS — M25511 Pain in right shoulder: Secondary | ICD-10-CM | POA: Diagnosis not present

## 2018-07-14 DIAGNOSIS — M7712 Lateral epicondylitis, left elbow: Secondary | ICD-10-CM | POA: Diagnosis not present

## 2018-07-14 DIAGNOSIS — M7711 Lateral epicondylitis, right elbow: Secondary | ICD-10-CM | POA: Diagnosis not present

## 2018-07-21 DIAGNOSIS — M7711 Lateral epicondylitis, right elbow: Secondary | ICD-10-CM | POA: Diagnosis not present

## 2018-07-21 DIAGNOSIS — M7712 Lateral epicondylitis, left elbow: Secondary | ICD-10-CM | POA: Diagnosis not present

## 2018-07-21 DIAGNOSIS — M7541 Impingement syndrome of right shoulder: Secondary | ICD-10-CM | POA: Diagnosis not present

## 2018-07-21 DIAGNOSIS — M7531 Calcific tendinitis of right shoulder: Secondary | ICD-10-CM | POA: Diagnosis not present

## 2018-07-24 DIAGNOSIS — M7712 Lateral epicondylitis, left elbow: Secondary | ICD-10-CM | POA: Diagnosis not present

## 2018-07-24 DIAGNOSIS — M7711 Lateral epicondylitis, right elbow: Secondary | ICD-10-CM | POA: Diagnosis not present

## 2018-07-24 DIAGNOSIS — M7541 Impingement syndrome of right shoulder: Secondary | ICD-10-CM | POA: Diagnosis not present

## 2018-07-24 DIAGNOSIS — M7531 Calcific tendinitis of right shoulder: Secondary | ICD-10-CM | POA: Diagnosis not present

## 2018-07-29 DIAGNOSIS — M7531 Calcific tendinitis of right shoulder: Secondary | ICD-10-CM | POA: Diagnosis not present

## 2018-07-29 DIAGNOSIS — M7711 Lateral epicondylitis, right elbow: Secondary | ICD-10-CM | POA: Diagnosis not present

## 2018-07-29 DIAGNOSIS — M7712 Lateral epicondylitis, left elbow: Secondary | ICD-10-CM | POA: Diagnosis not present

## 2018-07-29 DIAGNOSIS — M7541 Impingement syndrome of right shoulder: Secondary | ICD-10-CM | POA: Diagnosis not present

## 2018-08-04 DIAGNOSIS — M7531 Calcific tendinitis of right shoulder: Secondary | ICD-10-CM | POA: Diagnosis not present

## 2018-08-04 DIAGNOSIS — M7712 Lateral epicondylitis, left elbow: Secondary | ICD-10-CM | POA: Diagnosis not present

## 2018-08-04 DIAGNOSIS — M7711 Lateral epicondylitis, right elbow: Secondary | ICD-10-CM | POA: Diagnosis not present

## 2018-08-04 DIAGNOSIS — M7541 Impingement syndrome of right shoulder: Secondary | ICD-10-CM | POA: Diagnosis not present

## 2018-08-06 DIAGNOSIS — M7541 Impingement syndrome of right shoulder: Secondary | ICD-10-CM | POA: Diagnosis not present

## 2018-08-06 DIAGNOSIS — M7531 Calcific tendinitis of right shoulder: Secondary | ICD-10-CM | POA: Diagnosis not present

## 2018-08-06 DIAGNOSIS — M7712 Lateral epicondylitis, left elbow: Secondary | ICD-10-CM | POA: Diagnosis not present

## 2018-08-06 DIAGNOSIS — M7711 Lateral epicondylitis, right elbow: Secondary | ICD-10-CM | POA: Diagnosis not present

## 2018-08-11 DIAGNOSIS — M7711 Lateral epicondylitis, right elbow: Secondary | ICD-10-CM | POA: Diagnosis not present

## 2018-08-11 DIAGNOSIS — M7531 Calcific tendinitis of right shoulder: Secondary | ICD-10-CM | POA: Diagnosis not present

## 2018-08-11 DIAGNOSIS — M7712 Lateral epicondylitis, left elbow: Secondary | ICD-10-CM | POA: Diagnosis not present

## 2018-08-13 DIAGNOSIS — M7711 Lateral epicondylitis, right elbow: Secondary | ICD-10-CM | POA: Diagnosis not present

## 2018-08-13 DIAGNOSIS — M7712 Lateral epicondylitis, left elbow: Secondary | ICD-10-CM | POA: Diagnosis not present

## 2018-08-13 DIAGNOSIS — M7541 Impingement syndrome of right shoulder: Secondary | ICD-10-CM | POA: Diagnosis not present

## 2018-08-13 DIAGNOSIS — M7531 Calcific tendinitis of right shoulder: Secondary | ICD-10-CM | POA: Diagnosis not present

## 2018-08-20 DIAGNOSIS — Z0110 Encounter for examination of ears and hearing without abnormal findings: Secondary | ICD-10-CM | POA: Diagnosis not present

## 2018-08-20 DIAGNOSIS — H6983 Other specified disorders of Eustachian tube, bilateral: Secondary | ICD-10-CM | POA: Diagnosis not present

## 2018-08-20 DIAGNOSIS — H6982 Other specified disorders of Eustachian tube, left ear: Secondary | ICD-10-CM | POA: Diagnosis not present

## 2018-08-22 DIAGNOSIS — M7711 Lateral epicondylitis, right elbow: Secondary | ICD-10-CM | POA: Diagnosis not present

## 2018-08-22 DIAGNOSIS — M7541 Impingement syndrome of right shoulder: Secondary | ICD-10-CM | POA: Diagnosis not present

## 2018-08-22 DIAGNOSIS — M7531 Calcific tendinitis of right shoulder: Secondary | ICD-10-CM | POA: Diagnosis not present

## 2018-08-22 DIAGNOSIS — M7712 Lateral epicondylitis, left elbow: Secondary | ICD-10-CM | POA: Diagnosis not present

## 2018-09-09 DIAGNOSIS — M7541 Impingement syndrome of right shoulder: Secondary | ICD-10-CM | POA: Diagnosis not present

## 2018-09-09 DIAGNOSIS — M7711 Lateral epicondylitis, right elbow: Secondary | ICD-10-CM | POA: Diagnosis not present

## 2018-09-09 DIAGNOSIS — M7531 Calcific tendinitis of right shoulder: Secondary | ICD-10-CM | POA: Diagnosis not present

## 2018-09-09 DIAGNOSIS — M7712 Lateral epicondylitis, left elbow: Secondary | ICD-10-CM | POA: Diagnosis not present

## 2018-09-11 DIAGNOSIS — M7712 Lateral epicondylitis, left elbow: Secondary | ICD-10-CM | POA: Diagnosis not present

## 2018-09-11 DIAGNOSIS — M7531 Calcific tendinitis of right shoulder: Secondary | ICD-10-CM | POA: Diagnosis not present

## 2018-09-11 DIAGNOSIS — M7711 Lateral epicondylitis, right elbow: Secondary | ICD-10-CM | POA: Diagnosis not present

## 2018-09-11 DIAGNOSIS — M7541 Impingement syndrome of right shoulder: Secondary | ICD-10-CM | POA: Diagnosis not present

## 2018-09-16 DIAGNOSIS — M7711 Lateral epicondylitis, right elbow: Secondary | ICD-10-CM | POA: Diagnosis not present

## 2018-09-16 DIAGNOSIS — M7531 Calcific tendinitis of right shoulder: Secondary | ICD-10-CM | POA: Diagnosis not present

## 2018-09-16 DIAGNOSIS — M7712 Lateral epicondylitis, left elbow: Secondary | ICD-10-CM | POA: Diagnosis not present

## 2018-09-16 DIAGNOSIS — M7541 Impingement syndrome of right shoulder: Secondary | ICD-10-CM | POA: Diagnosis not present

## 2019-07-15 DIAGNOSIS — R7301 Impaired fasting glucose: Secondary | ICD-10-CM | POA: Diagnosis not present

## 2019-07-15 DIAGNOSIS — R946 Abnormal results of thyroid function studies: Secondary | ICD-10-CM | POA: Diagnosis not present

## 2019-07-17 DIAGNOSIS — N390 Urinary tract infection, site not specified: Secondary | ICD-10-CM | POA: Diagnosis not present

## 2019-07-20 DIAGNOSIS — B009 Herpesviral infection, unspecified: Secondary | ICD-10-CM | POA: Diagnosis not present

## 2019-07-20 DIAGNOSIS — K219 Gastro-esophageal reflux disease without esophagitis: Secondary | ICD-10-CM | POA: Diagnosis not present

## 2019-07-20 DIAGNOSIS — J301 Allergic rhinitis due to pollen: Secondary | ICD-10-CM | POA: Diagnosis not present

## 2019-07-20 DIAGNOSIS — R946 Abnormal results of thyroid function studies: Secondary | ICD-10-CM | POA: Diagnosis not present

## 2019-09-14 DIAGNOSIS — M545 Low back pain: Secondary | ICD-10-CM | POA: Diagnosis not present

## 2019-09-21 DIAGNOSIS — M545 Low back pain: Secondary | ICD-10-CM | POA: Diagnosis not present

## 2019-09-28 DIAGNOSIS — M545 Low back pain: Secondary | ICD-10-CM | POA: Diagnosis not present

## 2019-09-30 DIAGNOSIS — Z03818 Encounter for observation for suspected exposure to other biological agents ruled out: Secondary | ICD-10-CM | POA: Diagnosis not present

## 2019-09-30 DIAGNOSIS — Z20828 Contact with and (suspected) exposure to other viral communicable diseases: Secondary | ICD-10-CM | POA: Diagnosis not present

## 2019-10-02 DIAGNOSIS — B9789 Other viral agents as the cause of diseases classified elsewhere: Secondary | ICD-10-CM | POA: Diagnosis not present

## 2019-10-02 DIAGNOSIS — J988 Other specified respiratory disorders: Secondary | ICD-10-CM | POA: Diagnosis not present

## 2019-10-19 DIAGNOSIS — M545 Low back pain: Secondary | ICD-10-CM | POA: Diagnosis not present

## 2019-10-26 DIAGNOSIS — M545 Low back pain: Secondary | ICD-10-CM | POA: Diagnosis not present

## 2019-11-02 DIAGNOSIS — M545 Low back pain: Secondary | ICD-10-CM | POA: Diagnosis not present

## 2019-11-09 DIAGNOSIS — M545 Low back pain: Secondary | ICD-10-CM | POA: Diagnosis not present

## 2019-11-18 DIAGNOSIS — M545 Low back pain: Secondary | ICD-10-CM | POA: Diagnosis not present

## 2019-11-27 DIAGNOSIS — M545 Low back pain: Secondary | ICD-10-CM | POA: Diagnosis not present

## 2019-12-04 DIAGNOSIS — M545 Low back pain: Secondary | ICD-10-CM | POA: Diagnosis not present

## 2019-12-11 DIAGNOSIS — M545 Low back pain: Secondary | ICD-10-CM | POA: Diagnosis not present

## 2020-01-18 DIAGNOSIS — M545 Low back pain: Secondary | ICD-10-CM | POA: Diagnosis not present

## 2020-01-27 ENCOUNTER — Ambulatory Visit (INDEPENDENT_AMBULATORY_CARE_PROVIDER_SITE_OTHER): Payer: BC Managed Care – PPO | Admitting: Gastroenterology

## 2020-01-27 ENCOUNTER — Encounter: Payer: Self-pay | Admitting: Gastroenterology

## 2020-01-27 VITALS — BP 90/60 | HR 76 | Ht 62.0 in | Wt 133.0 lb

## 2020-01-27 DIAGNOSIS — R1011 Right upper quadrant pain: Secondary | ICD-10-CM | POA: Diagnosis not present

## 2020-01-27 DIAGNOSIS — K219 Gastro-esophageal reflux disease without esophagitis: Secondary | ICD-10-CM | POA: Diagnosis not present

## 2020-01-27 DIAGNOSIS — K8020 Calculus of gallbladder without cholecystitis without obstruction: Secondary | ICD-10-CM

## 2020-01-27 DIAGNOSIS — Z86010 Personal history of colonic polyps: Secondary | ICD-10-CM

## 2020-01-27 MED ORDER — SUTAB 1479-225-188 MG PO TABS: 1.0000 | ORAL_TABLET | ORAL | 0 refills | Status: DC

## 2020-01-27 NOTE — Progress Notes (Signed)
History of Present Illness: This is a 59 year old female referred by Cari Caraway, MD for the evaluation of GERD, RUQ pain, personal history of adenomatous colon polyps.  She relates that long history of GERD previously on medications.  Over the past few years her symptoms have generally been well controlled with diet.  She will take Maalox when she has infrequent episodes of breakthrough symptoms.  She relates difficulties with intermittent right upper quadrant pain exacerbated by high fat foods.  With modified in her diet the symptoms have not occurred for approximately 1 year.  Abdominal ultrasound in February 2017 showed cholelithiasis and was otherwise normal.  Prior colonoscopy by Dr. Earlean Shawl in January 2013 showed 2 tubular adenomas and internal hemorrhoids.  Prior EGD by Dr. Earlean Shawl in January 2013 was normal with CLO biopsy positive.  She was treated with quadruple therapy for 14 days. Denies weight loss, constipation, diarrhea, change in stool caliber, melena, hematochezia, nausea, vomiting, dysphagia, chest pain.    Allergies  Allergen Reactions  . Doxycycline   . Penicillins   . Tetracyclines & Related    Outpatient Medications Prior to Visit  Medication Sig Dispense Refill  . fish oil-omega-3 fatty acids 1000 MG capsule Take 2 g by mouth daily.    . multivitamin (THERAGRAN) per tablet Take 1 tablet by mouth daily.     No facility-administered medications prior to visit.   Past Medical History:  Diagnosis Date  . Endometriosis   . Erosive esophagitis   . GERD (gastroesophageal reflux disease)   . Mitral valve prolapse   . Positive H. pylori test   . Tuberculosis   . Tubular adenoma of colon 09/2011   Past Surgical History:  Procedure Laterality Date  . EXCISION OF TONGUE LESION     cyst under tongue  . TOTAL ABDOMINAL HYSTERECTOMY  04/2006  . WRIST FRACTURE SURGERY Left    Social History   Socioeconomic History  . Marital status: Single    Spouse name: Not on  file  . Number of children: 2  . Years of education: Not on file  . Highest education level: Not on file  Occupational History  . Occupation: Engineer, maintenance (IT)  Tobacco Use  . Smoking status: Never Smoker  . Smokeless tobacco: Never Used  Substance and Sexual Activity  . Alcohol use: Yes    Alcohol/week: 0.0 standard drinks    Comment: rarely 2-3 times per year  . Drug use: No  . Sexual activity: Not on file  Other Topics Concern  . Not on file  Social History Narrative  . Not on file   Social Determinants of Health   Financial Resource Strain:   . Difficulty of Paying Living Expenses:   Food Insecurity:   . Worried About Charity fundraiser in the Last Year:   . Arboriculturist in the Last Year:   Transportation Needs:   . Film/video editor (Medical):   Marland Kitchen Lack of Transportation (Non-Medical):   Physical Activity:   . Days of Exercise per Week:   . Minutes of Exercise per Session:   Stress:   . Feeling of Stress :   Social Connections:   . Frequency of Communication with Friends and Family:   . Frequency of Social Gatherings with Friends and Family:   . Attends Religious Services:   . Active Member of Clubs or Organizations:   . Attends Archivist Meetings:   Marland Kitchen Marital Status:    Family History  Problem Relation Age of Onset  . Heart attack Father 68  . Prostate cancer Father   . Lung cancer Father        smoker  . Hypertension Mother   . Epilepsy Mother        Review of Systems: Pertinent positive and negative review of systems were noted in the above HPI section. All other review of systems were otherwise negative.  Current Medications, Allergies, Past Medical History, Past Surgical History, Family History and Social History were reviewed in [employer] record.  Physical Exam: General: Well developed, well nourished, no acute distress Head: Normocephalic and atraumatic Eyes:  sclerae anicteric, EOMI Ears: Normal auditory  acuity Mouth: Not examined, mask on during Covid-19 pandemic Neck: Supple, no masses or thyromegaly Lungs: Clear throughout to auscultation Heart: Regular rate and rhythm; no murmurs, rubs or bruits Abdomen: Soft, non tender and non distended. No masses, hepatosplenomegaly or hernias noted. Normal Bowel sounds Rectal: Deferred to colonoscopy  Musculoskeletal: Symmetrical with no gross deformities  Skin: No lesions on visible extremities Pulses:  Normal pulses noted Extremities: No clubbing, cyanosis, edema or deformities noted Neurological: Alert oriented x 4, grossly nonfocal Cervical Nodes:  No significant cervical adenopathy Inguinal Nodes: No significant inguinal adenopathy Psychological:  Alert and cooperative. Normal mood and affect   Assessment and Recommendations:  1.  Right upper quadrant pain which has not been active for about 1 year.  Possible symptomatic cholelithiasis.  Minimize high-fat foods.  Rule out gastritis, ulcer.  Schedule EGD. The risks (including bleeding, perforation, infection, missed lesions, medication reactions and possible hospitalization or surgery if complications occur), benefits, and alternatives to endoscopy with possible biopsy and possible dilation were discussed with the patient and they consent to proceed.   2. GERD.  Continue to follow antireflux measures.  Continue current diet.  She is concerned about long-term risks of GERD including Barrett's esophagus and esophagitis.  Schedule EGD as above.  3. Personal history of adenomatous colon polyps in 2013.  Schedule surveillance colonoscopy. The risks (including bleeding, perforation, infection, missed lesions, medication reactions and possible hospitalization or surgery if complications occur), benefits, and alternatives to colonoscopy with possible biopsy and possible polypectomy were discussed with the patient and they consent to proceed.     cc: Cari Caraway, [address],   [place] [id number]

## 2020-01-27 NOTE — Patient Instructions (Signed)
You have been scheduled for an endoscopy and colonoscopy. Please follow the written instructions given to you at your visit today. Please pick up your prep supplies at the pharmacy within the next 1-3 days. If you use inhalers (even only as needed), please bring them with you on the day of your procedure.  Thank you for choosing me and [facility].  Malcolm T. Stark, Jr., MD., FACG   

## 2020-02-11 DIAGNOSIS — Z01419 Encounter for gynecological examination (general) (routine) without abnormal findings: Secondary | ICD-10-CM | POA: Diagnosis not present

## 2020-02-11 DIAGNOSIS — Z6825 Body mass index (BMI) 25.0-25.9, adult: Secondary | ICD-10-CM | POA: Diagnosis not present

## 2020-02-11 DIAGNOSIS — Z1231 Encounter for screening mammogram for malignant neoplasm of breast: Secondary | ICD-10-CM | POA: Diagnosis not present

## 2020-02-11 DIAGNOSIS — Z1272 Encounter for screening for malignant neoplasm of vagina: Secondary | ICD-10-CM | POA: Diagnosis not present

## 2020-02-15 ENCOUNTER — Other Ambulatory Visit: Payer: Self-pay | Admitting: Internal Medicine

## 2020-02-15 ENCOUNTER — Other Ambulatory Visit: Payer: Self-pay | Admitting: Obstetrics and Gynecology

## 2020-02-15 ENCOUNTER — Other Ambulatory Visit: Payer: BC Managed Care – PPO

## 2020-02-15 DIAGNOSIS — E2839 Other primary ovarian failure: Secondary | ICD-10-CM

## 2020-02-29 ENCOUNTER — Telehealth: Payer: Self-pay | Admitting: Gastroenterology

## 2020-02-29 MED ORDER — SUTAB 1479-225-188 MG PO TABS: 1.0000 | ORAL_TABLET | ORAL | 0 refills | Status: DC

## 2020-02-29 NOTE — Telephone Encounter (Signed)
Informed patient we sent prescription on the day of her appt but can send the prescription again since they did not receive it. Patient verbalized understanding.

## 2020-02-29 NOTE — Telephone Encounter (Signed)
Informed patient I contacted her pharmacy and gave them the coupon code info again verbally for Sutab. They did not run the prescription the last time. Pharmacist said they will contact patient when the prescription is ready but informed patient to check back with them in 10 minutes to verify they have ran the prescription code.

## 2020-02-29 NOTE — Telephone Encounter (Signed)
Patient states [facility] does not have her prep medication please advise

## 2020-03-21 ENCOUNTER — Encounter: Payer: Self-pay | Admitting: Gastroenterology

## 2020-03-30 ENCOUNTER — Ambulatory Visit (AMBULATORY_SURGERY_CENTER): Payer: BC Managed Care – PPO | Admitting: Gastroenterology

## 2020-03-30 ENCOUNTER — Other Ambulatory Visit: Payer: Self-pay

## 2020-03-30 ENCOUNTER — Encounter: Payer: Self-pay | Admitting: Gastroenterology

## 2020-03-30 VITALS — BP 116/73 | HR 68 | Temp 98.9°F | Resp 15 | Ht 62.0 in | Wt 133.0 lb

## 2020-03-30 DIAGNOSIS — K621 Rectal polyp: Secondary | ICD-10-CM | POA: Diagnosis not present

## 2020-03-30 DIAGNOSIS — B9681 Helicobacter pylori [H. pylori] as the cause of diseases classified elsewhere: Secondary | ICD-10-CM | POA: Diagnosis not present

## 2020-03-30 DIAGNOSIS — K219 Gastro-esophageal reflux disease without esophagitis: Secondary | ICD-10-CM | POA: Diagnosis not present

## 2020-03-30 DIAGNOSIS — K2970 Gastritis, unspecified, without bleeding: Secondary | ICD-10-CM

## 2020-03-30 DIAGNOSIS — K319 Disease of stomach and duodenum, unspecified: Secondary | ICD-10-CM

## 2020-03-30 DIAGNOSIS — Z86010 Personal history of colonic polyps: Secondary | ICD-10-CM

## 2020-03-30 DIAGNOSIS — Z1211 Encounter for screening for malignant neoplasm of colon: Secondary | ICD-10-CM | POA: Diagnosis not present

## 2020-03-30 DIAGNOSIS — D128 Benign neoplasm of rectum: Secondary | ICD-10-CM

## 2020-03-30 DIAGNOSIS — K2950 Unspecified chronic gastritis without bleeding: Secondary | ICD-10-CM | POA: Diagnosis not present

## 2020-03-30 MED ORDER — SODIUM CHLORIDE 0.9 % IV SOLN: 500.0000 mL | Freq: Once | INTRAVENOUS | Status: DC

## 2020-03-30 NOTE — Progress Notes (Signed)
A/ox3, pleased with MAC, report to RN 

## 2020-03-30 NOTE — Op Note (Addendum)
[place] Patient Name: [name] Procedure Date: 03/30/2020 10:43 AM MRN: [id number] Endoscopist: Ladene Artist , MD Age: 59 Referring MD:  Date of Birth: Feb 16, 1961 Gender: Female Account #: [id number] Procedure:                Colonoscopy Indications:              Surveillance: Personal history of adenomatous                            polyps on last colonoscopy > 5 years ago Medicines:                Monitored Anesthesia Care Procedure:                Pre-Anesthesia Assessment:                           - Prior to the procedure, a History and Physical                            was performed, and patient medications and                            allergies were reviewed. The patient's tolerance of                            previous anesthesia was also reviewed. The risks                            and benefits of the procedure and the sedation                            options and risks were discussed with the patient.                            All questions were answered, and informed consent                            was obtained. Prior Anticoagulants: The patient has                            taken no previous anticoagulant or antiplatelet                            agents. ASA Grade Assessment: II - A patient with                            mild systemic disease. After reviewing the risks                            and benefits, the patient was deemed in                            satisfactory condition to undergo the procedure.  After obtaining informed consent, the colonoscope                            was passed under direct vision. Throughout the                            procedure, the patient's blood pressure, pulse, and                            oxygen saturations were monitored continuously. The                            Colonoscope was introduced through the anus and                            advanced to the the  cecum, identified by                            appendiceal orifice and ileocecal valve. The                            ileocecal valve, appendiceal orifice, and rectum                            were photographed. The quality of the bowel                            preparation was excellent. The colonoscopy was                            performed without difficulty. The patient tolerated                            the procedure well. Scope In: 10:52:44 AM Scope Out: 11:08:48 AM Scope Withdrawal Time: 0 hours 11 minutes 19 seconds  Total Procedure Duration: 0 hours 16 minutes 4 seconds  Findings:                 The perianal and digital rectal examinations were                            normal.                           A 6 mm polyp was found in the rectum. The polyp was                            sessile. The polyp was removed with a cold snare.                            Resection and retrieval were complete.                           Internal hemorrhoids were found during  retroflexion. The hemorrhoids were small and Grade                            I (internal hemorrhoids that do not prolapse).                           The exam was otherwise without abnormality on                            direct and retroflexion views. Complications:            No immediate complications. Estimated blood loss:                            None. Estimated Blood Loss:     Estimated blood loss: none. Impression:               - One 6 mm polyp in the rectum, removed with a cold                            snare. Resected and retrieved.                           - Internal hemorrhoids.                           - The examination was otherwise normal on direct                            and retroflexion views. Recommendation:           - Repeat colonoscopy after studies are complete for                            surveillance based on pathology results.                            - Patient has a contact number available for                            emergencies. The signs and symptoms of potential                            delayed complications were discussed with the                            patient. Return to normal activities tomorrow.                            Written discharge instructions were provided to the                            patient.                           - Resume previous diet.                           -  Continue present medications.                           - Await pathology results. Ladene Artist, MD 03/30/2020 11:11:15 AM This report has been signed electronically.

## 2020-03-30 NOTE — Progress Notes (Signed)
Called to room to assist during endoscopic procedure.  Patient ID and intended procedure confirmed with present staff. Received instructions for my participation in the procedure from the performing physician.  

## 2020-03-30 NOTE — Patient Instructions (Signed)
Handouts given:  Polyps, Hemorrhoids, Gastritis Resume previous diet Continue current medications Await pathology results   YOU HAD AN ENDOSCOPIC PROCEDURE TODAY AT [facility]:   Refer to the procedure report that was given to you for any specific questions about what was found during the examination.  If the procedure report does not answer your questions, please call your gastroenterologist to clarify.  If you requested that your care partner not be given the details of your procedure findings, then the procedure report has been included in a sealed envelope for you to review at your convenience later.  YOU SHOULD EXPECT: Some feelings of bloating in the abdomen. Passage of more gas than usual.  Walking can help get rid of the air that was put into your GI tract during the procedure and reduce the bloating. If you had a lower endoscopy (such as a colonoscopy or flexible sigmoidoscopy) you may notice spotting of blood in your stool or on the toilet paper. If you underwent a bowel prep for your procedure, you may not have a normal bowel movement for a few days.  Please Note:  You might notice some irritation and congestion in your nose or some drainage.  This is from the oxygen used during your procedure.  There is no need for concern and it should clear up in a day or so.  SYMPTOMS TO REPORT IMMEDIATELY:   Following lower endoscopy (colonoscopy or flexible sigmoidoscopy):  Excessive amounts of blood in the stool  Significant tenderness or worsening of abdominal pains  Swelling of the abdomen that is new, acute  Fever of 100F or higher   Following upper endoscopy (EGD)  Vomiting of blood or coffee ground material  New chest pain or pain under the shoulder blades  Painful or persistently difficult swallowing  New shortness of breath  Fever of 100F or higher  Black, tarry-looking stools  For urgent or emergent issues, a gastroenterologist can be reached at any hour by  calling [phone]. Do not use MyChart messaging for urgent concerns.    DIET:  We do recommend a small meal at first, but then you may proceed to your regular diet.  Drink plenty of fluids but you should avoid alcoholic beverages for 24 hours.  ACTIVITY:  You should plan to take it easy for the rest of today and you should NOT DRIVE or use heavy machinery until tomorrow (because of the sedation medicines used during the test).    FOLLOW UP: Our staff will call the number listed on your records 48-72 hours following your procedure to check on you and address any questions or concerns that you may have regarding the information given to you following your procedure. If we do not reach you, we will leave a message.  We will attempt to reach you two times.  During this call, we will ask if you have developed any symptoms of COVID 19. If you develop any symptoms (ie: fever, flu-like symptoms, shortness of breath, cough etc.) before then, please call [phone].  If you test positive for Covid 19 in the 2 weeks post procedure, please call and report this information to [place].    If any biopsies were taken you will be contacted by phone or by letter within the next 1-3 weeks.  Please call us at [phone] if you have not heard about the biopsies in 3 weeks.    SIGNATURES/CONFIDENTIALITY: You and/or your care partner have signed paperwork which will be entered into  your electronic medical record.  These signatures attest to the fact that that the information above on your After Visit Summary has been reviewed and is understood.  Full responsibility of the confidentiality of this discharge information lies with you and/or your care-partner.

## 2020-03-30 NOTE — Op Note (Signed)
[facility] Patient Name: [name] Procedure Date: 03/30/2020 10:42 AM MRN: [id number] Endoscopist: Ladene Artist , MD Age: 59 Referring MD:  Date of Birth: 1961-05-10 Gender: Female Account #: [id number] Procedure:                Upper GI endoscopy Indications:              Gastroesophageal reflux disease Medicines:                Monitored Anesthesia Care Procedure:                Pre-Anesthesia Assessment:                           - Prior to the procedure, a History and Physical                            was performed, and patient medications and                            allergies were reviewed. The patient's tolerance of                            previous anesthesia was also reviewed. The risks                            and benefits of the procedure and the sedation                            options and risks were discussed with the patient.                            All questions were answered, and informed consent                            was obtained. Prior Anticoagulants: The patient has                            taken no previous anticoagulant or antiplatelet                            agents. ASA Grade Assessment: II - A patient with                            mild systemic disease. After reviewing the risks                            and benefits, the patient was deemed in                            satisfactory condition to undergo the procedure.                           After obtaining informed consent, the endoscope was  passed under direct vision. Throughout the                            procedure, the patient's blood pressure, pulse, and                            oxygen saturations were monitored continuously. The                            Endoscope was introduced through the mouth, and                            advanced to the second part of duodenum. The upper                            GI endoscopy was  accomplished without difficulty.                            The patient tolerated the procedure well. Scope In: Scope Out: Findings:                 The examined esophagus was normal.                           Patchy mildly erythematous mucosa without bleeding                            was found in the entire examined stomach. Biopsies                            were taken with a cold forceps for histology.                           The exam of the stomach was otherwise normal.                           The duodenal bulb and second portion of the                            duodenum were normal. Complications:            No immediate complications. Estimated Blood Loss:     Estimated blood loss was minimal. Impression:               - Normal esophagus.                           - Erythematous mucosa in the stomach. Biopsied.                           - Normal duodenal bulb and second portion of the                            duodenum. Recommendation:           - Patient has a contact number available for  emergencies. The signs and symptoms of potential                            delayed complications were discussed with the                            patient. Return to normal activities tomorrow.                            Written discharge instructions were provided to the                            patient.                           - Resume previous diet.                           - Continue present medications.                           - Await pathology results. Ladene Artist, MD 03/30/2020 11:20:42 AM This report has been signed electronically.

## 2020-04-01 ENCOUNTER — Telehealth: Payer: Self-pay

## 2020-04-01 NOTE — Telephone Encounter (Signed)
  Follow up Call-  Call back number 03/30/2020  Post procedure Call Back phone  # [phone]  Permission to leave phone message Yes  Some recent data might be hidden     Patient questions:  Do you have a fever, pain , or abdominal swelling? No. Pain Score  0 *  Have you tolerated food without any problems? Yes.    Have you been able to return to your normal activities? Yes.    Do you have any questions about your discharge instructions: Diet   No. Medications  No. Follow up visit  No.  Do you have questions or concerns about your Care? No.  Actions: * If pain score is 4 or above: No action needed, pain <4.  1. Have you developed a fever since your procedure? no  2.   Have you had an respiratory symptoms (SOB or cough) since your procedure? no  3.   Have you tested positive for COVID 19 since your procedure no  4.   Have you had any family members/close contacts diagnosed with the COVID 19 since your procedure?  no   If yes to any of these questions please route to Joylene John, RN and Erenest Rasher, RN

## 2020-04-11 ENCOUNTER — Other Ambulatory Visit: Payer: Self-pay

## 2020-04-11 DIAGNOSIS — A048 Other specified bacterial intestinal infections: Secondary | ICD-10-CM

## 2020-04-11 MED ORDER — BISMUTH SUBSALICYLATE 262 MG PO TABS
262.0000 mg | ORAL_TABLET | Freq: Four times a day (QID) | ORAL | 0 refills | Status: AC
Start: 2020-04-11 — End: 2020-04-25

## 2020-04-11 MED ORDER — CLARITHROMYCIN 500 MG PO TABS: 500.0000 mg | ORAL_TABLET | Freq: Two times a day (BID) | ORAL | 0 refills | Status: AC

## 2020-04-11 MED ORDER — METRONIDAZOLE 250 MG PO TABS: 250.0000 mg | ORAL_TABLET | Freq: Four times a day (QID) | ORAL | 0 refills | Status: AC

## 2020-04-11 MED ORDER — OMEPRAZOLE 20 MG PO CPDR: 20.0000 mg | DELAYED_RELEASE_CAPSULE | Freq: Two times a day (BID) | ORAL | 3 refills | Status: AC

## 2020-04-13 ENCOUNTER — Other Ambulatory Visit: Payer: BC Managed Care – PPO

## 2020-04-14 ENCOUNTER — Telehealth: Payer: Self-pay | Admitting: Gastroenterology

## 2020-04-14 NOTE — Telephone Encounter (Signed)
Spoke with patient, [place] regarding her results and asked me to review results and instructions again. Reviewed pathology results and advised on how to take medications for H. Pylori treatment, pt advised that she will need to avoid alcohol while taking these medications. Pt advised that she will need to come back to the lab in 6 weeks for a stool antigen test to make sure that the bacteria was eradicated. Advised to hold PPI prior to leaving stool sample.

## 2020-04-14 NOTE — Telephone Encounter (Signed)
Patient states she had procedure and was also given a few medications to start however is has questions on all of them. Please advise

## 2020-05-02 ENCOUNTER — Other Ambulatory Visit: Payer: Self-pay

## 2020-05-02 ENCOUNTER — Ambulatory Visit
Admission: RE | Admit: 2020-05-02 | Discharge: 2020-05-02 | Disposition: A | Discharge status: Home / Self Care | Payer: BC Managed Care – PPO | Source: Ambulatory Visit | Attending: Obstetrics and Gynecology | Admitting: Obstetrics and Gynecology

## 2020-05-02 DIAGNOSIS — Z780 Asymptomatic menopausal state: Secondary | ICD-10-CM | POA: Diagnosis not present

## 2020-05-02 DIAGNOSIS — E2839 Other primary ovarian failure: Secondary | ICD-10-CM

## 2020-05-02 DIAGNOSIS — M8589 Other specified disorders of bone density and structure, multiple sites: Secondary | ICD-10-CM | POA: Diagnosis not present

## 2020-05-07 DIAGNOSIS — Z20822 Contact with and (suspected) exposure to covid-19: Secondary | ICD-10-CM | POA: Diagnosis not present

## 2020-05-30 ENCOUNTER — Other Ambulatory Visit: Payer: BC Managed Care – PPO

## 2020-05-30 DIAGNOSIS — A048 Other specified bacterial intestinal infections: Secondary | ICD-10-CM | POA: Diagnosis not present

## 2020-05-30 DIAGNOSIS — R1011 Right upper quadrant pain: Secondary | ICD-10-CM | POA: Diagnosis not present

## 2020-05-30 DIAGNOSIS — K219 Gastro-esophageal reflux disease without esophagitis: Secondary | ICD-10-CM | POA: Diagnosis not present

## 2020-06-02 LAB — H. PYLORI ANTIGEN, STOOL: H pylori Ag, Stl: NEGATIVE

## 2020-06-07 DIAGNOSIS — M8580 Other specified disorders of bone density and structure, unspecified site: Secondary | ICD-10-CM | POA: Diagnosis not present

## 2020-06-28 DIAGNOSIS — Z23 Encounter for immunization: Secondary | ICD-10-CM | POA: Diagnosis not present

## 2020-08-09 DIAGNOSIS — Z20822 Contact with and (suspected) exposure to covid-19: Secondary | ICD-10-CM | POA: Diagnosis not present

## 2020-08-09 DIAGNOSIS — Z03818 Encounter for observation for suspected exposure to other biological agents ruled out: Secondary | ICD-10-CM | POA: Diagnosis not present

## 2020-09-08 DIAGNOSIS — R946 Abnormal results of thyroid function studies: Secondary | ICD-10-CM | POA: Diagnosis not present

## 2020-09-08 DIAGNOSIS — J45901 Unspecified asthma with (acute) exacerbation: Secondary | ICD-10-CM | POA: Diagnosis not present

## 2020-09-18 ENCOUNTER — Encounter (HOSPITAL_BASED_OUTPATIENT_CLINIC_OR_DEPARTMENT_OTHER): Payer: Self-pay | Admitting: Emergency Medicine

## 2020-09-18 ENCOUNTER — Other Ambulatory Visit: Payer: Self-pay

## 2020-09-18 ENCOUNTER — Emergency Department (HOSPITAL_BASED_OUTPATIENT_CLINIC_OR_DEPARTMENT_OTHER)
Admission: EM | Admit: 2020-09-18 | Discharge: 2020-09-18 | Disposition: A | Discharge status: Home / Self Care | Payer: BC Managed Care – PPO | Attending: Emergency Medicine | Admitting: Emergency Medicine

## 2020-09-18 DIAGNOSIS — G43109 Migraine with aura, not intractable, without status migrainosus: Secondary | ICD-10-CM

## 2020-09-18 DIAGNOSIS — G43B0 Ophthalmoplegic migraine, not intractable: Secondary | ICD-10-CM | POA: Insufficient documentation

## 2020-09-18 DIAGNOSIS — H5319 Other subjective visual disturbances: Secondary | ICD-10-CM

## 2020-09-18 DIAGNOSIS — H5315 Visual distortions of shape and size: Secondary | ICD-10-CM | POA: Insufficient documentation

## 2020-09-18 DIAGNOSIS — Z91040 Latex allergy status: Secondary | ICD-10-CM | POA: Insufficient documentation

## 2020-09-18 DIAGNOSIS — G43809 Other migraine, not intractable, without status migrainosus: Secondary | ICD-10-CM | POA: Diagnosis not present

## 2020-09-18 DIAGNOSIS — H539 Unspecified visual disturbance: Secondary | ICD-10-CM | POA: Diagnosis not present

## 2020-09-18 DIAGNOSIS — R519 Headache, unspecified: Secondary | ICD-10-CM | POA: Diagnosis not present

## 2020-09-18 MED ORDER — TETRACAINE HCL 0.5 % OP SOLN
OPHTHALMIC | Status: AC
  Filled 2020-09-18: qty 4

## 2020-09-18 MED ORDER — FLUORESCEIN SODIUM 1 MG OP STRP
ORAL_STRIP | OPHTHALMIC | Status: AC
  Filled 2020-09-18: qty 1

## 2020-09-18 NOTE — Discharge Instructions (Addendum)
We suspect that the vision change is due to ocular migraine or some damage to the eye that Dr. Posey Pronto would like to further diagnose. Please call Dr. Serita Grit clinic on Tuesday.

## 2020-09-18 NOTE — ED Triage Notes (Signed)
Reports right eye "dust" "floating", reports seeing flashing in right eye onset today. Reports increased stress at work. Denies pain.

## 2020-09-19 NOTE — ED Provider Notes (Addendum)
[facility] Provider Note   CSN: [id number] Arrival date & time: 09/18/20  2017     History Chief Complaint  Patient presents with  . Eye Problem    [name] is a 60 y.o. female.  HPI    60 year old female with history of esophagitis, seasonal allergies, endometriosis comes in a chief complaint of eye problem.  Patient reports that over the last 24 hours she has noticed "D" shaped singular floater moving around her eye.  She has had these symptoms in the past during tax season when she is under a lot of stress.  She did switch her job recently and is having significant stress because of it.  What got her concern was that she also started noticing " lightening" like lines coming down intermittently, but quite frequently in her right eye.  She has no vision loss, double vision, blurry vision.  She denies eye pain or headaches, but does indicate that her head feels heavy.  Review of system is negative for any focal numbness, weakness. No hx of strokes.  No history of hypertension, hyperlipidemia, smoking and patient denies any neck pain or trauma   Past Medical History:  Diagnosis Date  . Allergy   . Endometriosis   . Erosive esophagitis   . GERD (gastroesophageal reflux disease)   . Mitral valve prolapse   . Positive H. pylori test   . Tuberculosis   . Tubular adenoma of colon 09/2011    There are no problems to display for this patient.   Past Surgical History:  Procedure Laterality Date  . EXCISION OF TONGUE LESION     cyst under tongue  . TOTAL ABDOMINAL HYSTERECTOMY  04/2006  . WRIST FRACTURE SURGERY Left      OB History   No obstetric history on file.     Family History  Problem Relation Age of Onset  . Heart attack Father 80  . Prostate cancer Father   . Lung cancer Father        smoker  . Hypertension Mother   . Epilepsy Mother   . Colon cancer Neg Hx   . Esophageal cancer Neg Hx   . Inflammatory bowel disease  Neg Hx   . Irritable bowel syndrome Neg Hx     Social History   Tobacco Use  . Smoking status: Never Smoker  . Smokeless tobacco: Never Used  Substance Use Topics  . Alcohol use: Yes    Alcohol/week: 0.0 standard drinks    Comment: rarely 2-3 times per year  . Drug use: No    Home Medications Prior to Admission medications   Medication Sig Start Date End Date Taking? Authorizing Provider  fish oil-omega-3 fatty acids 1000 MG capsule Take 2 g by mouth daily.    [provider]  multivitamin [facility]) per tablet Take 1 tablet by mouth daily.    [provider]  omeprazole (PRILOSEC) 20 MG capsule Take 1 capsule (20 mg total) by mouth in the morning and at bedtime for 14 days. 04/11/20 04/25/20  Ladene Artist, MD    Allergies    Doxycycline, Penicillins, Tetracyclines & related, and Latex  Review of Systems   Review of Systems  Constitutional: Positive for activity change.  Eyes: Positive for visual disturbance. Negative for photophobia.  Respiratory: Negative for shortness of breath.   Cardiovascular: Negative for chest pain.  Gastrointestinal: Negative for nausea and vomiting.  Musculoskeletal: Negative for neck pain.  All other systems reviewed  and are negative.   Physical Exam Updated Vital Signs BP (!) 165/88 (BP Location: Right Arm)   Pulse 82   Temp 98.2 F (36.8 C) (Oral)   Resp 15   Wt 58.1 kg   SpO2 95%   BMI 23.41 kg/m   Physical Exam Vitals and nursing note reviewed.  Constitutional:      Appearance: She is well-developed.  HENT:     Head: Normocephalic and atraumatic.  Eyes:     Extraocular Movements: Extraocular movements intact and EOM normal.     Pupils: Pupils are equal, round, and reactive to light.     Comments: Peripheral vision is normal, gross visual acuity screen is also normal.   Cardiovascular:     Rate and Rhythm: Normal rate.  Pulmonary:     Effort: Pulmonary effort is normal.  Abdominal:     General: Bowel  sounds are normal.  Musculoskeletal:     Cervical back: Normal range of motion and neck supple.  Skin:    General: Skin is warm and dry.  Neurological:     Mental Status: She is alert and oriented to person, place, and time.     Cranial Nerves: No cranial nerve deficit.     Sensory: No sensory deficit.     Motor: No weakness.     ED Results / Procedures / Treatments   Labs (all labs ordered are listed, but only abnormal results are displayed) Labs Reviewed - No data to display  EKG None  Radiology No results found.  Procedures Ultrasound ED Ocular  Date/Time: 09/19/2020 5:48 PM Performed by: Varney Biles, MD Authorized by: Varney Biles, MD   PROCEDURE DETAILS:    Indications: visual change     Assessed:  Right eye   Right eye axial view: obtained     Right eye sagittal view: obtained     Images: archived   RIGHT EYE FINDINGS:     no foreign body noted in right eye    right eye lens not dislodged    no retrobulbar hematoma in right eye    no evidence of retinal detachment of the right eye    no vitreous hemorrhage in right eye   (including critical care time)  Medications Ordered in ED Medications - No data to display  ED Course  I have reviewed the triage vital signs and the nursing notes.  Pertinent labs & imaging results that were available during my care of the patient were reviewed by me and considered in my medical decision making (see chart for details).    MDM Rules/Calculators/A&P                          60 year old female comes in a chief complaint of vision change.  She has no significant medical history.  It appears that every time she has stress, she sees these floating objects in her right eye.  She is coming off of a stressful week and is again noticing those floating objects, but this time she also has lightninglike strikes that she is noticing.  Suspect that patient has retinal detachment versus vitreous hemorrhage.  Other possibility  includes ocular migraine.  Monocular vision change, do not suspect stroke.  Reassessment: Spoke with Dr. Posey Pronto, ophthalmology, they will see the patient in the clinic likely on Tuesday. Strict ER return precautions have been discussed, and patient is agreeing with the plan and is comfortable with the workup done and the recommendations from  the ER.   Final Clinical Impression(s) / ED Diagnoses Final diagnoses:  Ocular migraine  Visual distortion    Rx / DC Orders ED Discharge Orders    None         Varney Biles, MD 09/19/20 1751

## 2020-09-21 DIAGNOSIS — H43391 Other vitreous opacities, right eye: Secondary | ICD-10-CM | POA: Diagnosis not present

## 2020-09-21 DIAGNOSIS — H53141 Visual discomfort, right eye: Secondary | ICD-10-CM | POA: Diagnosis not present

## 2020-09-21 DIAGNOSIS — H43811 Vitreous degeneration, right eye: Secondary | ICD-10-CM | POA: Diagnosis not present

## 2020-09-21 DIAGNOSIS — H33311 Horseshoe tear of retina without detachment, right eye: Secondary | ICD-10-CM | POA: Diagnosis not present

## 2020-09-26 DIAGNOSIS — H33311 Horseshoe tear of retina without detachment, right eye: Secondary | ICD-10-CM | POA: Diagnosis not present

## 2020-10-12 DIAGNOSIS — H43391 Other vitreous opacities, right eye: Secondary | ICD-10-CM | POA: Diagnosis not present

## 2020-10-12 DIAGNOSIS — H53141 Visual discomfort, right eye: Secondary | ICD-10-CM | POA: Diagnosis not present

## 2020-10-12 DIAGNOSIS — H43811 Vitreous degeneration, right eye: Secondary | ICD-10-CM | POA: Diagnosis not present

## 2020-10-21 DIAGNOSIS — M545 Low back pain, unspecified: Secondary | ICD-10-CM | POA: Diagnosis not present

## 2020-10-28 DIAGNOSIS — M545 Low back pain: Secondary | ICD-10-CM | POA: Diagnosis not present

## 2020-11-04 DIAGNOSIS — M545 Low back pain: Secondary | ICD-10-CM | POA: Diagnosis not present

## 2020-11-11 DIAGNOSIS — M545 Low back pain, unspecified: Secondary | ICD-10-CM | POA: Diagnosis not present

## 2020-11-18 DIAGNOSIS — M545 Low back pain, unspecified: Secondary | ICD-10-CM | POA: Diagnosis not present

## 2020-11-25 DIAGNOSIS — M545 Low back pain, unspecified: Secondary | ICD-10-CM | POA: Diagnosis not present

## 2020-12-06 DIAGNOSIS — M545 Low back pain, unspecified: Secondary | ICD-10-CM | POA: Diagnosis not present

## 2020-12-16 DIAGNOSIS — M545 Low back pain, unspecified: Secondary | ICD-10-CM | POA: Diagnosis not present

## 2021-02-14 DIAGNOSIS — H33311 Horseshoe tear of retina without detachment, right eye: Secondary | ICD-10-CM | POA: Diagnosis not present

## 2021-03-08 DIAGNOSIS — Z01419 Encounter for gynecological examination (general) (routine) without abnormal findings: Secondary | ICD-10-CM | POA: Diagnosis not present

## 2021-03-08 DIAGNOSIS — Z1231 Encounter for screening mammogram for malignant neoplasm of breast: Secondary | ICD-10-CM | POA: Diagnosis not present

## 2021-04-19 DIAGNOSIS — M545 Low back pain, unspecified: Secondary | ICD-10-CM | POA: Diagnosis not present

## 2021-06-19 DIAGNOSIS — Z23 Encounter for immunization: Secondary | ICD-10-CM | POA: Diagnosis not present

## 2021-06-19 DIAGNOSIS — M25549 Pain in joints of unspecified hand: Secondary | ICD-10-CM | POA: Diagnosis not present

## 2021-06-19 DIAGNOSIS — B009 Herpesviral infection, unspecified: Secondary | ICD-10-CM | POA: Diagnosis not present

## 2021-06-19 DIAGNOSIS — M79643 Pain in unspecified hand: Secondary | ICD-10-CM | POA: Diagnosis not present

## 2021-06-21 DIAGNOSIS — M545 Low back pain, unspecified: Secondary | ICD-10-CM | POA: Diagnosis not present

## 2021-07-17 DIAGNOSIS — H21342 Primary cyst of pars plana, left eye: Secondary | ICD-10-CM | POA: Diagnosis not present

## 2021-07-17 DIAGNOSIS — H43812 Vitreous degeneration, left eye: Secondary | ICD-10-CM | POA: Diagnosis not present

## 2021-07-17 DIAGNOSIS — H35362 Drusen (degenerative) of macula, left eye: Secondary | ICD-10-CM | POA: Diagnosis not present

## 2021-07-17 DIAGNOSIS — H35452 Secondary pigmentary degeneration, left eye: Secondary | ICD-10-CM | POA: Diagnosis not present

## 2021-07-18 DIAGNOSIS — E038 Other specified hypothyroidism: Secondary | ICD-10-CM | POA: Diagnosis not present

## 2021-07-18 DIAGNOSIS — Z0000 Encounter for general adult medical examination without abnormal findings: Secondary | ICD-10-CM | POA: Diagnosis not present

## 2021-07-18 DIAGNOSIS — R768 Other specified abnormal immunological findings in serum: Secondary | ICD-10-CM | POA: Diagnosis not present

## 2021-07-18 DIAGNOSIS — Z23 Encounter for immunization: Secondary | ICD-10-CM | POA: Diagnosis not present

## 2021-07-18 DIAGNOSIS — J45901 Unspecified asthma with (acute) exacerbation: Secondary | ICD-10-CM | POA: Diagnosis not present

## 2021-08-01 DIAGNOSIS — H33311 Horseshoe tear of retina without detachment, right eye: Secondary | ICD-10-CM | POA: Diagnosis not present

## 2021-08-01 DIAGNOSIS — H43813 Vitreous degeneration, bilateral: Secondary | ICD-10-CM | POA: Diagnosis not present

## 2021-09-28 DIAGNOSIS — H35362 Drusen (degenerative) of macula, left eye: Secondary | ICD-10-CM | POA: Diagnosis not present

## 2021-09-28 DIAGNOSIS — H21342 Primary cyst of pars plana, left eye: Secondary | ICD-10-CM | POA: Diagnosis not present

## 2021-09-28 DIAGNOSIS — H33311 Horseshoe tear of retina without detachment, right eye: Secondary | ICD-10-CM | POA: Diagnosis not present

## 2021-09-28 DIAGNOSIS — H31091 Other chorioretinal scars, right eye: Secondary | ICD-10-CM | POA: Diagnosis not present

## 2021-10-13 DIAGNOSIS — M7989 Other specified soft tissue disorders: Secondary | ICD-10-CM | POA: Diagnosis not present

## 2021-10-13 DIAGNOSIS — R5383 Other fatigue: Secondary | ICD-10-CM | POA: Diagnosis not present

## 2021-10-13 DIAGNOSIS — M130 Polyarthritis, unspecified: Secondary | ICD-10-CM | POA: Diagnosis not present

## 2021-10-26 DIAGNOSIS — M0579 Rheumatoid arthritis with rheumatoid factor of multiple sites without organ or systems involvement: Secondary | ICD-10-CM | POA: Diagnosis not present

## 2021-12-25 DIAGNOSIS — M0579 Rheumatoid arthritis with rheumatoid factor of multiple sites without organ or systems involvement: Secondary | ICD-10-CM | POA: Diagnosis not present

## 2021-12-26 DIAGNOSIS — H02841 Edema of right upper eyelid: Secondary | ICD-10-CM | POA: Diagnosis not present

## 2021-12-26 DIAGNOSIS — H01114 Allergic dermatitis of left upper eyelid: Secondary | ICD-10-CM | POA: Diagnosis not present

## 2021-12-26 DIAGNOSIS — H01111 Allergic dermatitis of right upper eyelid: Secondary | ICD-10-CM | POA: Diagnosis not present

## 2021-12-26 DIAGNOSIS — H35371 Puckering of macula, right eye: Secondary | ICD-10-CM | POA: Diagnosis not present

## 2022-01-31 DIAGNOSIS — M0579 Rheumatoid arthritis with rheumatoid factor of multiple sites without organ or systems involvement: Secondary | ICD-10-CM | POA: Diagnosis not present

## 2022-03-09 DIAGNOSIS — Z1231 Encounter for screening mammogram for malignant neoplasm of breast: Secondary | ICD-10-CM | POA: Diagnosis not present

## 2022-03-09 DIAGNOSIS — Z01419 Encounter for gynecological examination (general) (routine) without abnormal findings: Secondary | ICD-10-CM | POA: Diagnosis not present

## 2022-03-09 DIAGNOSIS — Z6824 Body mass index (BMI) 24.0-24.9, adult: Secondary | ICD-10-CM | POA: Diagnosis not present

## 2022-03-12 ENCOUNTER — Other Ambulatory Visit: Payer: Self-pay | Admitting: Obstetrics and Gynecology

## 2022-03-12 DIAGNOSIS — E2839 Other primary ovarian failure: Secondary | ICD-10-CM

## 2022-03-28 DIAGNOSIS — H43393 Other vitreous opacities, bilateral: Secondary | ICD-10-CM | POA: Diagnosis not present

## 2022-03-28 DIAGNOSIS — H31091 Other chorioretinal scars, right eye: Secondary | ICD-10-CM | POA: Diagnosis not present

## 2022-03-28 DIAGNOSIS — H35371 Puckering of macula, right eye: Secondary | ICD-10-CM | POA: Diagnosis not present

## 2022-04-11 ENCOUNTER — Ambulatory Visit
Admission: RE | Admit: 2022-04-11 | Discharge: 2022-04-11 | Disposition: A | Discharge status: Home / Self Care | Payer: BC Managed Care – PPO | Source: Ambulatory Visit | Attending: Obstetrics and Gynecology | Admitting: Obstetrics and Gynecology

## 2022-04-11 DIAGNOSIS — M8589 Other specified disorders of bone density and structure, multiple sites: Secondary | ICD-10-CM | POA: Diagnosis not present

## 2022-04-11 DIAGNOSIS — E2839 Other primary ovarian failure: Secondary | ICD-10-CM

## 2022-04-11 DIAGNOSIS — Z780 Asymptomatic menopausal state: Secondary | ICD-10-CM | POA: Diagnosis not present

## 2022-04-25 DIAGNOSIS — M545 Low back pain, unspecified: Secondary | ICD-10-CM | POA: Diagnosis not present

## 2022-04-26 DIAGNOSIS — M0579 Rheumatoid arthritis with rheumatoid factor of multiple sites without organ or systems involvement: Secondary | ICD-10-CM | POA: Diagnosis not present

## 2022-04-26 DIAGNOSIS — M7989 Other specified soft tissue disorders: Secondary | ICD-10-CM | POA: Diagnosis not present

## 2022-05-15 DIAGNOSIS — M545 Low back pain, unspecified: Secondary | ICD-10-CM | POA: Diagnosis not present

## 2022-05-22 DIAGNOSIS — M545 Low back pain, unspecified: Secondary | ICD-10-CM | POA: Diagnosis not present

## 2022-08-23 DIAGNOSIS — G629 Polyneuropathy, unspecified: Secondary | ICD-10-CM | POA: Diagnosis not present

## 2022-08-23 DIAGNOSIS — M0579 Rheumatoid arthritis with rheumatoid factor of multiple sites without organ or systems involvement: Secondary | ICD-10-CM | POA: Diagnosis not present

## 2022-09-28 DIAGNOSIS — Z23 Encounter for immunization: Secondary | ICD-10-CM | POA: Diagnosis not present

## 2022-10-10 DIAGNOSIS — H35373 Puckering of macula, bilateral: Secondary | ICD-10-CM | POA: Diagnosis not present

## 2022-10-10 DIAGNOSIS — H31091 Other chorioretinal scars, right eye: Secondary | ICD-10-CM | POA: Diagnosis not present

## 2022-10-10 DIAGNOSIS — H25813 Combined forms of age-related cataract, bilateral: Secondary | ICD-10-CM | POA: Diagnosis not present

## 2022-10-10 DIAGNOSIS — H43813 Vitreous degeneration, bilateral: Secondary | ICD-10-CM | POA: Diagnosis not present

## 2022-10-11 DIAGNOSIS — Z131 Encounter for screening for diabetes mellitus: Secondary | ICD-10-CM | POA: Diagnosis not present

## 2022-10-11 DIAGNOSIS — E038 Other specified hypothyroidism: Secondary | ICD-10-CM | POA: Diagnosis not present

## 2022-10-11 DIAGNOSIS — Z13220 Encounter for screening for lipoid disorders: Secondary | ICD-10-CM | POA: Diagnosis not present

## 2022-10-18 DIAGNOSIS — Z23 Encounter for immunization: Secondary | ICD-10-CM | POA: Diagnosis not present

## 2022-10-18 DIAGNOSIS — E038 Other specified hypothyroidism: Secondary | ICD-10-CM | POA: Diagnosis not present

## 2022-10-18 DIAGNOSIS — Z0000 Encounter for general adult medical examination without abnormal findings: Secondary | ICD-10-CM | POA: Diagnosis not present

## 2022-10-18 DIAGNOSIS — M05741 Rheumatoid arthritis with rheumatoid factor of right hand without organ or systems involvement: Secondary | ICD-10-CM | POA: Diagnosis not present

## 2022-10-29 DIAGNOSIS — M0579 Rheumatoid arthritis with rheumatoid factor of multiple sites without organ or systems involvement: Secondary | ICD-10-CM | POA: Diagnosis not present

## 2022-10-29 DIAGNOSIS — G629 Polyneuropathy, unspecified: Secondary | ICD-10-CM | POA: Diagnosis not present

## 2022-11-01 DIAGNOSIS — Z713 Dietary counseling and surveillance: Secondary | ICD-10-CM | POA: Diagnosis not present

## 2022-11-01 DIAGNOSIS — E785 Hyperlipidemia, unspecified: Secondary | ICD-10-CM | POA: Diagnosis not present

## 2022-11-23 DIAGNOSIS — M5451 Vertebrogenic low back pain: Secondary | ICD-10-CM | POA: Diagnosis not present

## 2022-11-30 DIAGNOSIS — M5451 Vertebrogenic low back pain: Secondary | ICD-10-CM | POA: Diagnosis not present

## 2022-12-07 DIAGNOSIS — M5451 Vertebrogenic low back pain: Secondary | ICD-10-CM | POA: Diagnosis not present

## 2023-03-04 DIAGNOSIS — M0579 Rheumatoid arthritis with rheumatoid factor of multiple sites without organ or systems involvement: Secondary | ICD-10-CM | POA: Diagnosis not present

## 2023-03-04 DIAGNOSIS — G629 Polyneuropathy, unspecified: Secondary | ICD-10-CM | POA: Diagnosis not present

## 2023-03-04 DIAGNOSIS — M7989 Other specified soft tissue disorders: Secondary | ICD-10-CM | POA: Diagnosis not present

## 2023-03-05 DIAGNOSIS — M7989 Other specified soft tissue disorders: Secondary | ICD-10-CM | POA: Diagnosis not present

## 2023-03-08 DIAGNOSIS — Z6825 Body mass index (BMI) 25.0-25.9, adult: Secondary | ICD-10-CM | POA: Diagnosis not present

## 2023-03-08 DIAGNOSIS — B9689 Other specified bacterial agents as the cause of diseases classified elsewhere: Secondary | ICD-10-CM | POA: Diagnosis not present

## 2023-03-08 DIAGNOSIS — J329 Chronic sinusitis, unspecified: Secondary | ICD-10-CM | POA: Diagnosis not present

## 2023-03-14 DIAGNOSIS — R350 Frequency of micturition: Secondary | ICD-10-CM | POA: Diagnosis not present

## 2023-03-14 DIAGNOSIS — Z01419 Encounter for gynecological examination (general) (routine) without abnormal findings: Secondary | ICD-10-CM | POA: Diagnosis not present

## 2023-03-14 DIAGNOSIS — Z6825 Body mass index (BMI) 25.0-25.9, adult: Secondary | ICD-10-CM | POA: Diagnosis not present

## 2023-03-14 DIAGNOSIS — Z1231 Encounter for screening mammogram for malignant neoplasm of breast: Secondary | ICD-10-CM | POA: Diagnosis not present

## 2023-04-09 DIAGNOSIS — Z13220 Encounter for screening for lipoid disorders: Secondary | ICD-10-CM | POA: Diagnosis not present

## 2023-06-10 DIAGNOSIS — Z6826 Body mass index (BMI) 26.0-26.9, adult: Secondary | ICD-10-CM | POA: Diagnosis not present

## 2023-06-10 DIAGNOSIS — J45901 Unspecified asthma with (acute) exacerbation: Secondary | ICD-10-CM | POA: Diagnosis not present

## 2023-06-10 DIAGNOSIS — J0191 Acute recurrent sinusitis, unspecified: Secondary | ICD-10-CM | POA: Diagnosis not present

## 2023-06-12 DIAGNOSIS — H43813 Vitreous degeneration, bilateral: Secondary | ICD-10-CM | POA: Diagnosis not present

## 2023-06-12 DIAGNOSIS — H35373 Puckering of macula, bilateral: Secondary | ICD-10-CM | POA: Diagnosis not present

## 2023-06-12 DIAGNOSIS — H25813 Combined forms of age-related cataract, bilateral: Secondary | ICD-10-CM | POA: Diagnosis not present

## 2023-06-12 DIAGNOSIS — H31091 Other chorioretinal scars, right eye: Secondary | ICD-10-CM | POA: Diagnosis not present

## 2023-07-18 DIAGNOSIS — E785 Hyperlipidemia, unspecified: Secondary | ICD-10-CM | POA: Diagnosis not present

## 2023-09-09 DIAGNOSIS — R5383 Other fatigue: Secondary | ICD-10-CM | POA: Diagnosis not present

## 2023-09-09 DIAGNOSIS — R6889 Other general symptoms and signs: Secondary | ICD-10-CM | POA: Diagnosis not present

## 2023-09-09 DIAGNOSIS — M0579 Rheumatoid arthritis with rheumatoid factor of multiple sites without organ or systems involvement: Secondary | ICD-10-CM | POA: Diagnosis not present

## 2023-09-09 DIAGNOSIS — Z79899 Other long term (current) drug therapy: Secondary | ICD-10-CM | POA: Diagnosis not present

## 2023-09-09 DIAGNOSIS — E559 Vitamin D deficiency, unspecified: Secondary | ICD-10-CM | POA: Diagnosis not present

## 2023-09-09 DIAGNOSIS — G629 Polyneuropathy, unspecified: Secondary | ICD-10-CM | POA: Diagnosis not present

## 2023-09-13 DIAGNOSIS — M545 Low back pain, unspecified: Secondary | ICD-10-CM | POA: Diagnosis not present

## 2023-09-15 DIAGNOSIS — J019 Acute sinusitis, unspecified: Secondary | ICD-10-CM | POA: Diagnosis not present

## 2023-09-15 DIAGNOSIS — H6982 Other specified disorders of Eustachian tube, left ear: Secondary | ICD-10-CM | POA: Diagnosis not present

## 2023-10-23 DIAGNOSIS — M79643 Pain in unspecified hand: Secondary | ICD-10-CM | POA: Diagnosis not present

## 2023-10-23 DIAGNOSIS — Z13220 Encounter for screening for lipoid disorders: Secondary | ICD-10-CM | POA: Diagnosis not present

## 2023-10-23 DIAGNOSIS — M85852 Other specified disorders of bone density and structure, left thigh: Secondary | ICD-10-CM | POA: Diagnosis not present

## 2023-10-23 DIAGNOSIS — E038 Other specified hypothyroidism: Secondary | ICD-10-CM | POA: Diagnosis not present

## 2023-10-28 DIAGNOSIS — E038 Other specified hypothyroidism: Secondary | ICD-10-CM | POA: Diagnosis not present

## 2023-10-28 DIAGNOSIS — Z0000 Encounter for general adult medical examination without abnormal findings: Secondary | ICD-10-CM | POA: Diagnosis not present

## 2023-10-28 DIAGNOSIS — Z1331 Encounter for screening for depression: Secondary | ICD-10-CM | POA: Diagnosis not present

## 2023-10-28 DIAGNOSIS — J45909 Unspecified asthma, uncomplicated: Secondary | ICD-10-CM | POA: Diagnosis not present

## 2023-10-28 DIAGNOSIS — M85852 Other specified disorders of bone density and structure, left thigh: Secondary | ICD-10-CM | POA: Diagnosis not present

## 2023-10-28 DIAGNOSIS — R0789 Other chest pain: Secondary | ICD-10-CM | POA: Diagnosis not present

## 2023-11-04 DIAGNOSIS — M545 Low back pain, unspecified: Secondary | ICD-10-CM | POA: Diagnosis not present

## 2023-12-18 DIAGNOSIS — H31091 Other chorioretinal scars, right eye: Secondary | ICD-10-CM | POA: Diagnosis not present

## 2023-12-18 DIAGNOSIS — H35362 Drusen (degenerative) of macula, left eye: Secondary | ICD-10-CM | POA: Diagnosis not present

## 2023-12-18 DIAGNOSIS — H35373 Puckering of macula, bilateral: Secondary | ICD-10-CM | POA: Diagnosis not present

## 2023-12-18 DIAGNOSIS — H43813 Vitreous degeneration, bilateral: Secondary | ICD-10-CM | POA: Diagnosis not present

## 2024-03-03 DIAGNOSIS — R6889 Other general symptoms and signs: Secondary | ICD-10-CM | POA: Diagnosis not present

## 2024-03-03 DIAGNOSIS — G629 Polyneuropathy, unspecified: Secondary | ICD-10-CM | POA: Diagnosis not present

## 2024-03-03 DIAGNOSIS — M0579 Rheumatoid arthritis with rheumatoid factor of multiple sites without organ or systems involvement: Secondary | ICD-10-CM | POA: Diagnosis not present

## 2024-03-03 DIAGNOSIS — R5383 Other fatigue: Secondary | ICD-10-CM | POA: Diagnosis not present

## 2024-03-17 DIAGNOSIS — Z1231 Encounter for screening mammogram for malignant neoplasm of breast: Secondary | ICD-10-CM | POA: Diagnosis not present

## 2024-03-17 DIAGNOSIS — R350 Frequency of micturition: Secondary | ICD-10-CM | POA: Diagnosis not present

## 2024-03-17 DIAGNOSIS — Z01419 Encounter for gynecological examination (general) (routine) without abnormal findings: Secondary | ICD-10-CM | POA: Diagnosis not present

## 2024-04-21 DIAGNOSIS — M0579 Rheumatoid arthritis with rheumatoid factor of multiple sites without organ or systems involvement: Secondary | ICD-10-CM | POA: Diagnosis not present

## 2024-05-11 DIAGNOSIS — R7989 Other specified abnormal findings of blood chemistry: Secondary | ICD-10-CM | POA: Diagnosis not present

## 2024-05-11 DIAGNOSIS — E038 Other specified hypothyroidism: Secondary | ICD-10-CM | POA: Diagnosis not present

## 2024-08-31 DIAGNOSIS — R5383 Other fatigue: Secondary | ICD-10-CM | POA: Diagnosis not present

## 2024-08-31 DIAGNOSIS — R6889 Other general symptoms and signs: Secondary | ICD-10-CM | POA: Diagnosis not present

## 2024-08-31 DIAGNOSIS — M0579 Rheumatoid arthritis with rheumatoid factor of multiple sites without organ or systems involvement: Secondary | ICD-10-CM | POA: Diagnosis not present

## 2024-08-31 DIAGNOSIS — E559 Vitamin D deficiency, unspecified: Secondary | ICD-10-CM | POA: Diagnosis not present
